# Patient Record
Sex: Female | Born: 1985 | Race: Black or African American | Hispanic: No | Marital: Single | Smoking: Never smoker
Health system: Southern US, Community
[De-identification: ages and names within clinical notes are randomized; demographics above are authoritative.]

## PROBLEM LIST (undated history)

## (undated) ENCOUNTER — Ambulatory Visit: Disposition: A | Discharge status: Home / Self Care | Payer: 59

## (undated) DIAGNOSIS — G500 Trigeminal neuralgia: Secondary | ICD-10-CM

## (undated) DIAGNOSIS — A6000 Herpesviral infection of urogenital system, unspecified: Secondary | ICD-10-CM

## (undated) DIAGNOSIS — Z9289 Personal history of other medical treatment: Secondary | ICD-10-CM

## (undated) DIAGNOSIS — G8929 Other chronic pain: Secondary | ICD-10-CM

## (undated) DIAGNOSIS — E669 Obesity, unspecified: Secondary | ICD-10-CM

## (undated) DIAGNOSIS — M25562 Pain in left knee: Secondary | ICD-10-CM

## (undated) DIAGNOSIS — M25561 Pain in right knee: Secondary | ICD-10-CM

## (undated) HISTORY — DX: Pain in right knee: M25.561

## (undated) HISTORY — DX: Obesity, unspecified: E66.9

## (undated) HISTORY — DX: Pain in left knee: M25.562

## (undated) HISTORY — DX: Trigeminal neuralgia: G50.0

## (undated) HISTORY — PX: WISDOM TOOTH EXTRACTION: SHX21

## (undated) HISTORY — DX: Personal history of other medical treatment: Z92.89

## (undated) HISTORY — DX: Herpesviral infection of urogenital system, unspecified: A60.00

## (undated) HISTORY — DX: Other chronic pain: G89.29

---
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
## 2004-01-26 ENCOUNTER — Emergency Department (HOSPITAL_COMMUNITY): Admission: EM | Admit: 2004-01-26 | Discharge: 2004-01-27 | Payer: Self-pay | Admitting: Emergency Medicine

## 2004-02-24 ENCOUNTER — Emergency Department (HOSPITAL_COMMUNITY): Admission: EM | Admit: 2004-02-24 | Discharge: 2004-02-24 | Payer: Self-pay | Admitting: Emergency Medicine

## 2005-01-27 ENCOUNTER — Observation Stay: Payer: Self-pay

## 2005-01-29 ENCOUNTER — Observation Stay: Payer: Self-pay | Admitting: Unknown Physician Specialty

## 2005-03-08 ENCOUNTER — Observation Stay: Payer: Self-pay | Admitting: Unknown Physician Specialty

## 2005-03-16 ENCOUNTER — Observation Stay: Payer: Self-pay

## 2005-04-14 ENCOUNTER — Inpatient Hospital Stay: Payer: Self-pay

## 2005-12-25 ENCOUNTER — Emergency Department: Payer: Self-pay | Admitting: Emergency Medicine

## 2007-01-24 ENCOUNTER — Emergency Department: Payer: Self-pay | Admitting: Emergency Medicine

## 2008-05-19 ENCOUNTER — Emergency Department: Payer: Self-pay | Admitting: Emergency Medicine

## 2012-11-13 ENCOUNTER — Emergency Department: Payer: Self-pay | Admitting: Emergency Medicine

## 2012-11-13 LAB — HCG, QUANTITATIVE, PREGNANCY: Beta Hcg, Quant.: <1 m[IU]/mL — ABNORMAL LOW

## 2012-11-13 LAB — CBC
HCT: 38.7 % (ref 35.0–47.0)
MCV: 83 fL (ref 80–100)
Platelet: 288 10*3/uL (ref 150–440)
RBC: 4.69 10*6/uL (ref 3.80–5.20)
RDW: 13.6 % (ref 11.5–14.5)
WBC: 9.4 10*3/uL (ref 3.6–11.0)

## 2012-11-13 LAB — COMPREHENSIVE METABOLIC PANEL
Alkaline Phosphatase: 62 U/L (ref 50–136)
Anion Gap: 6 — ABNORMAL LOW (ref 7–16)
BUN: 7 mg/dL (ref 7–18)
Bilirubin,Total: 0.4 mg/dL (ref 0.2–1.0)
Calcium, Total: 9 mg/dL (ref 8.5–10.1)
Chloride: 103 mmol/L (ref 98–107)
Co2: 29 mmol/L (ref 21–32)
Creatinine: 0.93 mg/dL (ref 0.60–1.30)
EGFR (African American): >60
EGFR (Non-African Amer.): >60
Osmolality: 274 (ref 275–301)
SGPT (ALT): 21 U/L (ref 12–78)
Sodium: 138 mmol/L (ref 136–145)
Total Protein: 8 g/dL (ref 6.4–8.2)

## 2012-11-13 LAB — WET PREP, GENITAL

## 2012-11-14 LAB — URINALYSIS, COMPLETE
Bilirubin,UR: NEGATIVE
Glucose,UR: NEGATIVE mg/dL (ref 0–75)
Nitrite: POSITIVE
RBC,UR: 2568 /HPF (ref 0–5)
Specific Gravity: 1.025 (ref 1.003–1.030)
Squamous Epithelial: 8
WBC UR: 3 /HPF (ref 0–5)

## 2014-06-10 ENCOUNTER — Ambulatory Visit: Payer: Self-pay | Admitting: Neurology

## 2014-06-10 LAB — HCG, QUANTITATIVE, PREGNANCY: Beta Hcg, Quant.: <1 m[IU]/mL — ABNORMAL LOW

## 2014-06-18 ENCOUNTER — Emergency Department: Payer: Self-pay | Admitting: Emergency Medicine

## 2014-10-30 ENCOUNTER — Ambulatory Visit: Payer: Self-pay | Admitting: Emergency Medicine

## 2014-10-30 LAB — PREGNANCY, URINE: Pregnancy Test, Urine: NEGATIVE m[IU]/mL

## 2014-11-17 ENCOUNTER — Ambulatory Visit: Payer: Self-pay | Admitting: Physician Assistant

## 2014-11-20 ENCOUNTER — Ambulatory Visit: Payer: Self-pay | Admitting: Family Medicine

## 2014-12-29 ENCOUNTER — Emergency Department: Payer: Self-pay | Admitting: Emergency Medicine

## 2015-03-10 ENCOUNTER — Ambulatory Visit: Payer: Self-pay | Admitting: Family Medicine

## 2016-01-21 ENCOUNTER — Encounter: Payer: Self-pay | Admitting: Emergency Medicine

## 2016-01-21 ENCOUNTER — Ambulatory Visit
Admission: EM | Admit: 2016-01-21 | Discharge: 2016-01-21 | Disposition: A | Discharge status: Home / Self Care | Payer: BLUE CROSS/BLUE SHIELD | Attending: Family Medicine | Admitting: Family Medicine

## 2016-01-21 DIAGNOSIS — J069 Acute upper respiratory infection, unspecified: Secondary | ICD-10-CM

## 2016-01-21 MED ORDER — AZITHROMYCIN 250 MG PO TABS: ORAL_TABLET | ORAL | Status: DC

## 2016-01-21 MED ORDER — HYDROCOD POLST-CPM POLST ER 10-8 MG/5ML PO SUER: 5.0000 mL | Freq: Every evening | ORAL | Status: DC | PRN

## 2016-01-21 NOTE — ED Provider Notes (Signed)
Patient presents initially with symptoms of nasal congestion and mild productive cough. Patient states that she's had the symptoms since last Thursday. She has coughed so much that it has caused her to vomit at times. She has been taking Delsym and an allergy medication that the pharmacist had recommended. She denies any fevers, chest pain, shortness of breath, severe headache, abdominal pain, diarrhea. She denies any history of smoking, asthma, COPD.  ROS: Negative except mentioned above.  Vitals as per Epic.  GENERAL: NAD HEENT: mild pharyngeal erythema, no exudate, no erythema of TMs, no cervical LAD RESP: CTA B CARD: RRR NEURO: CN II-XII grossly intact  A/P: URI- Z-Pak, Tussionex when necessary at bedtime, Delsym during the daytime, Claritin when necessary, Tylenol/Motrin prn, rest, hydration, seek medical attention if symptoms persist or worsen as discussed.   Jolene Provost, MD 01/21/16 [phone]

## 2016-01-21 NOTE — ED Notes (Signed)
Patient c/o cough and chest congestion since last Thursday.  Patient states that she vomited once last night.  Patient denies fevers.

## 2016-02-21 ENCOUNTER — Emergency Department
Admission: EM | Admit: 2016-02-21 | Discharge: 2016-02-21 | Disposition: A | Discharge status: Home / Self Care | Payer: 59 | Attending: Emergency Medicine | Admitting: Emergency Medicine

## 2016-02-21 ENCOUNTER — Encounter: Payer: Self-pay | Admitting: Emergency Medicine

## 2016-02-21 DIAGNOSIS — Y998 Other external cause status: Secondary | ICD-10-CM | POA: Diagnosis not present

## 2016-02-21 DIAGNOSIS — S4990XA Unspecified injury of shoulder and upper arm, unspecified arm, initial encounter: Secondary | ICD-10-CM | POA: Diagnosis not present

## 2016-02-21 DIAGNOSIS — Y92410 Unspecified street and highway as the place of occurrence of the external cause: Secondary | ICD-10-CM | POA: Diagnosis not present

## 2016-02-21 DIAGNOSIS — S199XXA Unspecified injury of neck, initial encounter: Secondary | ICD-10-CM | POA: Diagnosis present

## 2016-02-21 DIAGNOSIS — Y9389 Activity, other specified: Secondary | ICD-10-CM | POA: Diagnosis not present

## 2016-02-21 DIAGNOSIS — S161XXA Strain of muscle, fascia and tendon at neck level, initial encounter: Secondary | ICD-10-CM | POA: Insufficient documentation

## 2016-02-21 DIAGNOSIS — Z79899 Other long term (current) drug therapy: Secondary | ICD-10-CM | POA: Diagnosis not present

## 2016-02-21 MED ORDER — TRAMADOL HCL 50 MG PO TABS: 50.0000 mg | ORAL_TABLET | Freq: Four times a day (QID) | ORAL | Status: AC | PRN

## 2016-02-21 MED ORDER — IBUPROFEN 600 MG PO TABS: 600.0000 mg | ORAL_TABLET | Freq: Three times a day (TID) | ORAL | Status: DC | PRN

## 2016-02-21 MED ORDER — METHOCARBAMOL 750 MG PO TABS: 1500.0000 mg | ORAL_TABLET | Freq: Four times a day (QID) | ORAL | Status: DC
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
## 2016-02-21 NOTE — Discharge Instructions (Signed)

## 2016-11-30 ENCOUNTER — Emergency Department: Payer: 59

## 2016-11-30 ENCOUNTER — Emergency Department
Admission: EM | Admit: 2016-11-30 | Discharge: 2016-11-30 | Disposition: A | Discharge status: Home / Self Care | Payer: 59 | Attending: Emergency Medicine | Admitting: Emergency Medicine

## 2016-11-30 ENCOUNTER — Encounter: Payer: Self-pay | Admitting: Emergency Medicine

## 2016-11-30 DIAGNOSIS — Y929 Unspecified place or not applicable: Secondary | ICD-10-CM | POA: Diagnosis not present

## 2016-11-30 DIAGNOSIS — Z79899 Other long term (current) drug therapy: Secondary | ICD-10-CM | POA: Insufficient documentation

## 2016-11-30 DIAGNOSIS — Y939 Activity, unspecified: Secondary | ICD-10-CM | POA: Diagnosis not present

## 2016-11-30 DIAGNOSIS — S299XXA Unspecified injury of thorax, initial encounter: Secondary | ICD-10-CM | POA: Diagnosis present

## 2016-11-30 DIAGNOSIS — X58XXXA Exposure to other specified factors, initial encounter: Secondary | ICD-10-CM | POA: Diagnosis not present

## 2016-11-30 DIAGNOSIS — S29012A Strain of muscle and tendon of back wall of thorax, initial encounter: Secondary | ICD-10-CM | POA: Diagnosis not present

## 2016-11-30 DIAGNOSIS — Y999 Unspecified external cause status: Secondary | ICD-10-CM | POA: Insufficient documentation

## 2016-11-30 DIAGNOSIS — T148XXA Other injury of unspecified body region, initial encounter: Secondary | ICD-10-CM

## 2016-11-30 MED ORDER — KETOROLAC TROMETHAMINE 60 MG/2ML IM SOLN
60.0000 mg | Freq: Once | INTRAMUSCULAR | Status: AC
  Administered 2016-11-30: 60 mg via INTRAMUSCULAR
  Filled 2016-11-30: qty 2

## 2016-11-30 MED ORDER — MELOXICAM 15 MG PO TABS: 15.0000 mg | ORAL_TABLET | Freq: Every day | ORAL | 0 refills | Status: AC

## 2016-11-30 NOTE — ED Provider Notes (Signed)
[facility] Emergency Department Provider Note  ____________________________________________  Time seen: Approximately 6:59 PM  I have reviewed the triage vital signs and the nursing notes.   HISTORY  Chief Complaint Back Pain    HPI Sarah Tommye StandardCorinne Botts is a 30 y.o. female that presents with 2 days of mid back pain. Pain is worse with movement. Pain does not radiate. No numbness or tingling in fingers or toes. Patient denies fever, chills, trauma.   History reviewed. No pertinent past medical history.  There are no active problems to display for this patient.   Past Surgical History:  Procedure Laterality Date  . WISDOM TOOTH EXTRACTION      Prior to Admission medications   Medication Sig Start Date End Date Taking? Authorizing Provider  azithromycin (ZITHROMAX Z-PAK) 250 MG tablet Use as directed for 5 days as on box. 01/21/16   Jolene ProvostKirtida Patel, MD  chlorpheniramine-HYDROcodone [facility](TUSSIONEX PENNKINETIC ER) 10-8 MG/5ML SUER Take 5 mLs by mouth at bedtime as needed for cough. 01/21/16   Jolene ProvostKirtida Patel, MD  ibuprofen (ADVIL,MOTRIN) 600 MG tablet Take 1 tablet (600 mg total) by mouth every 8 (eight) hours as needed. 02/21/16   Joni Reiningonald K Smith, PA-C  levonorgestrel (MIRENA) 20 MCG/24HR IUD 1 each by Intrauterine route once.    Historical Provider, MD  meloxicam (MOBIC) 15 MG tablet Take 1 tablet (15 mg total) by mouth daily. 11/30/16 12/10/16  Enid DerryAshley Tanav Orsak, PA-C  methocarbamol (ROBAXIN-750) 750 MG tablet Take 2 tablets (1,500 mg total) by mouth 4 (four) times daily. 02/21/16   Joni Reiningonald K Smith, PA-C  traMADol (ULTRAM) 50 MG tablet Take 1 tablet (50 mg total) by mouth every 6 (six) hours as needed. 02/21/16 02/20/17  Joni Reiningonald K Smith, PA-C    Allergies Patient has no known allergies.  No family history on file.  Social History Social History  Substance Use Topics  . Smoking status: Never Smoker  . Smokeless tobacco: Never Used  . Alcohol use No     Review of  Systems  Constitutional: No fever/chills ENT: No upper respiratory complaints. Cardiovascular: no chest pain. Respiratory: no cough. No SOB. Gastrointestinal: No abdominal pain.  No nausea, no vomiting.   Genitourinary: Negative for incontinence. Musculoskeletal: Negative for pain in upper or lower extremities. Skin: Negative for rash, abrasions, lacerations, ecchymosis. Neurological: Negative for headaches, focal weakness, numbess.  ____________________________________________   PHYSICAL EXAM:  VITAL SIGNS: ED Triage Vitals [11/30/16 1822]  Enc Vitals Group     BP 128/69     Pulse Rate 80     Resp 18     Temp 99.1 F (37.3 C)     Temp Source Oral     SpO2 98 %     Weight 230 lb (104.3 kg)     Height 5\' 10"  (1.778 m)     Head Circumference      Peak Flow      Pain Score 10     Pain Loc      Pain Edu?      Excl. in GC?      Constitutional: Alert and oriented. Well appearing and in no acute distress. Eyes: Conjunctivae are normal. PERRL. EOMI. Head: Atraumatic. ENT:      Ears:       Nose: No congestion/rhinnorhea.      Mouth/Throat: Mucous membranes are moist.  Neck: No stridor.  Cardiovascular: Normal rate, regular rhythm. Normal S1 and S2.  Good peripheral circulation. Respiratory: Normal respiratory effort without tachypnea or retractions. Lungs  CTAB. Good air entry to the bases with no decreased or absent breath sounds. Gastrointestinal:. Soft and nontender to palpation. Musculoskeletal: Full range of motion to all extremities. No gross deformities appreciated. Pinpoint tenderness over thoracic spine. Tenderness over muscles on the left side of mid back. No tenderness over SI joints. Neurologic:  Normal speech and language. No gross focal neurologic deficits are appreciated.  Skin:  Skin is warm, dry and intact. No rash noted. Psychiatric: Mood and affect are normal. Speech and behavior are normal. Patient exhibits appropriate insight and  judgement.   ____________________________________________   LABS (all labs ordered are listed, but only abnormal results are displayed)  Labs Reviewed - No data to display ____________________________________________  EKG   ____________________________________________  RADIOLOGY Lexine BatonI, Harsha Yusko, personally viewed and evaluated these images (plain radiographs) as part of my medical decision making, as well as reviewing the written report by the radiologist.  Dg Thoracic Spine 2 View  Result Date: 11/30/2016 CLINICAL DATA:  Thoracic back pain, onset yesterday. No known injury. Pain point tenderness. EXAM: THORACIC SPINE 2 VIEWS COMPARISON:  [facility]. FINDINGS: The alignment is maintained. Vertebral body heights are maintained. No significant disc space narrowing. Posterior elements appear intact. No fracture. There is no paravertebral soft tissue abnormality. IMPRESSION: Negative radiographs of the thoracic spine. Electronically Signed   By: Rubye OaksMelanie  Ehinger M.D.   On: 11/30/2016 19:21    ____________________________________________    PROCEDURES  Procedure(s) performed:    Procedures    Medications  ketorolac (TORADOL) injection 60 mg (60 mg Intramuscular Given 11/30/16 1952)     ____________________________________________   INITIAL IMPRESSION / ASSESSMENT AND PLAN / ED COURSE  Pertinent labs & imaging results that were available during my care of the patient were reviewed by me and considered in my medical decision making (see chart for details).  Review of the [place] CSRS was performed in accordance of the [facility] prior to dispensing any controlled drugs.  Clinical Course as of Dec 01 2015  Tue Nov 30, 2016  1937 DG Thoracic Spine 2 View [AW]  [phone] DG Thoracic Spine 2 View [AW]    Clinical Course User Index [AW] Enid DerryAshley Mardy Hoppe, PA-C   Patient presented to the emergency department with mid back pain for 2 days. X-ray was ordered because patient had pinpoint tenderness  over thoracic spine. No gross neurological defects are appreciated.Patient's diagnosis is consistent with muscle strain. Patient will be discharged home with prescriptions for meloxicam. Patient is to follow up with PCP as needed or otherwise directed. Patient is given ED precautions to return to the ED for any worsening or new symptoms.     ____________________________________________  FINAL CLINICAL IMPRESSION(S) / ED DIAGNOSES  Final diagnoses:  Muscle strain      NEW MEDICATIONS STARTED DURING THIS VISIT:  Discharge Medication List as of 11/30/2016  8:00 PM    START taking these medications   Details  meloxicam (MOBIC) 15 MG tablet Take 1 tablet (15 mg total) by mouth daily., Starting Tue 11/30/2016, Until Fri 12/10/2016, Print            This chart was dictated using voice recognition software/Dragon. Despite best efforts to proofread, errors can occur which can change the meaning. Any change was purely unintentional.   Enid DerryAshley Marcene Laskowski, PA-C 11/30/16 2018    Rockne MenghiniAnne-Caroline Norman, MD 11/30/16 [phone]

## 2016-11-30 NOTE — ED Triage Notes (Signed)
Patient presents to the ED with mid-back pain since yesterday.  Patient denies history of back pain and denies known injury.  Patient reports pain is worse with movement.  Patient ambulatory to triage with no obvious distress.

## 2016-11-30 NOTE — ED Notes (Signed)

## 2017-02-06 DIAGNOSIS — Z79899 Other long term (current) drug therapy: Secondary | ICD-10-CM | POA: Diagnosis not present

## 2017-02-06 DIAGNOSIS — R51 Headache: Secondary | ICD-10-CM | POA: Diagnosis not present

## 2017-02-06 DIAGNOSIS — R6884 Jaw pain: Secondary | ICD-10-CM | POA: Diagnosis not present

## 2017-02-06 DIAGNOSIS — G500 Trigeminal neuralgia: Secondary | ICD-10-CM | POA: Diagnosis not present

## 2017-02-08 DIAGNOSIS — R51 Headache: Secondary | ICD-10-CM | POA: Diagnosis not present

## 2017-03-08 DIAGNOSIS — R51 Headache: Secondary | ICD-10-CM | POA: Diagnosis not present

## 2017-06-07 DIAGNOSIS — M25561 Pain in right knee: Secondary | ICD-10-CM | POA: Diagnosis not present

## 2017-06-07 DIAGNOSIS — G500 Trigeminal neuralgia: Secondary | ICD-10-CM | POA: Diagnosis not present

## 2017-07-21 ENCOUNTER — Ambulatory Visit (INDEPENDENT_AMBULATORY_CARE_PROVIDER_SITE_OTHER): Payer: 59 | Admitting: Certified Nurse Midwife

## 2017-07-21 ENCOUNTER — Encounter: Payer: Self-pay | Admitting: Certified Nurse Midwife

## 2017-07-21 VITALS — BP 112/72 | HR 77 | Ht 70.0 in | Wt 250.0 lb

## 2017-07-21 DIAGNOSIS — M25562 Pain in left knee: Secondary | ICD-10-CM

## 2017-07-21 DIAGNOSIS — E669 Obesity, unspecified: Secondary | ICD-10-CM | POA: Insufficient documentation

## 2017-07-21 DIAGNOSIS — E66812 Obesity, class 2: Secondary | ICD-10-CM

## 2017-07-21 DIAGNOSIS — Z124 Encounter for screening for malignant neoplasm of cervix: Secondary | ICD-10-CM | POA: Diagnosis not present

## 2017-07-21 DIAGNOSIS — Z13220 Encounter for screening for lipoid disorders: Secondary | ICD-10-CM | POA: Diagnosis not present

## 2017-07-21 DIAGNOSIS — A6000 Herpesviral infection of urogenital system, unspecified: Secondary | ICD-10-CM | POA: Diagnosis not present

## 2017-07-21 DIAGNOSIS — M25561 Pain in right knee: Secondary | ICD-10-CM

## 2017-07-21 DIAGNOSIS — Z6835 Body mass index (BMI) 35.0-35.9, adult: Secondary | ICD-10-CM

## 2017-07-21 DIAGNOSIS — R8290 Unspecified abnormal findings in urine: Secondary | ICD-10-CM

## 2017-07-21 DIAGNOSIS — G8929 Other chronic pain: Secondary | ICD-10-CM | POA: Insufficient documentation

## 2017-07-21 DIAGNOSIS — E6609 Other obesity due to excess calories: Secondary | ICD-10-CM

## 2017-07-21 DIAGNOSIS — Z1329 Encounter for screening for other suspected endocrine disorder: Secondary | ICD-10-CM | POA: Diagnosis not present

## 2017-07-21 DIAGNOSIS — R7309 Other abnormal glucose: Secondary | ICD-10-CM | POA: Diagnosis not present

## 2017-07-21 DIAGNOSIS — R3915 Urgency of urination: Secondary | ICD-10-CM | POA: Diagnosis not present

## 2017-07-21 DIAGNOSIS — Z01419 Encounter for gynecological examination (general) (routine) without abnormal findings: Secondary | ICD-10-CM

## 2017-07-21 DIAGNOSIS — G500 Trigeminal neuralgia: Secondary | ICD-10-CM | POA: Insufficient documentation

## 2017-07-21 DIAGNOSIS — Z113 Encounter for screening for infections with a predominantly sexual mode of transmission: Secondary | ICD-10-CM

## 2017-07-21 LAB — POCT WET PREP (WET MOUNT): TRICHOMONAS WET PREP HPF POC: ABSENT

## 2017-07-21 LAB — POCT URINALYSIS DIPSTICK
Blood, UA: NEGATIVE
GLUCOSE UA: NEGATIVE
LEUKOCYTES UA: NEGATIVE
Nitrite, UA: NEGATIVE
Spec Grav, UA: 1.015 (ref 1.010–1.025)
Urobilinogen, UA: 0.2 E.U./dL
pH, UA: 6.5 (ref 5.0–8.0)

## 2017-07-21 MED ORDER — TINIDAZOLE 500 MG PO TABS: 1.0000 g | ORAL_TABLET | Freq: Every day | ORAL | 0 refills | Status: AC

## 2017-07-21 MED ORDER — VALACYCLOVIR HCL 500 MG PO TABS: ORAL_TABLET | ORAL | 1 refills | Status: DC

## 2017-07-21 MED ORDER — SECNIDAZOLE 2 G PO PACK: 2.0000 g | PACK | Freq: Once | ORAL | 0 refills | Status: DC

## 2017-07-21 NOTE — Progress Notes (Signed)
Gynecology Annual Exam  PCP: [facility] in [place] Chief Complaint:  Chief Complaint  Patient presents with  . Gynecologic Exam    History of Present Illness: Sarah Bryant StandardCorinne Bryant is a 31 y.o. G2P1011 presents for annual exam. The patient complains of a "strong odor to her urine, urgency and urinating small amounts intermittently. No dysuria.  Her menses are absent due to her Mirena IUD Last pap smear: 05/26/2016, results were NIL/neg   The patient is sexually active. She has had a new sexual partner in the last year and desires STD testing. She currently uses the Mirena IUD for contraception. Her Mirena was inserted 11/14/2012  Since her last visit, she has had no significant changes in her health.  Her past medical history is remarkable for HSVII (uses Valtrex episodically), trigeminal neuralgia, obesity.  The patient does not perform self breast exams. Her last mammogram was NA  There is no family history of breast cancer.    There is no family history of ovarian cancer.   The patient denies smoking.  She denies drinking. alcohol  She denies illegal drug use.  The patient reports exercising occasionally. Has gained 4# in the last year. Current BMI=35.87 kg/m2  The patient gets adequate calcium in her diet.  Her last cholesterol screen was in 2016 and was  Borderline (T chol 201) and her last diabetes screen was 2017 and her hemoglobin A1C was 6.1%.  The patient denies current symptoms of depression.    Review of Systems: Review of Systems  Constitutional: Negative for chills, fever and weight loss.  HENT: Negative for congestion, sinus pain and sore throat.   Eyes: Negative for blurred vision and pain.  Respiratory: Negative for hemoptysis, shortness of breath and wheezing.   Cardiovascular: Negative for chest pain, palpitations and leg swelling.  Gastrointestinal: Negative for abdominal pain, blood in stool, diarrhea, heartburn, nausea and vomiting.    Genitourinary: Positive for urgency (intermittently). Negative for dysuria, frequency and hematuria.       Positive for a strong odor to urine, urgency and voiding small amounts intermittently. Positive for amenorrhea  Musculoskeletal: Positive for joint pain (bilateral knees). Negative for back pain and myalgias.  Skin: Negative for itching and rash.  Neurological: Negative for dizziness, tingling and headaches.  Endo/Heme/Allergies: Negative for environmental allergies and polydipsia. Does not bruise/bleed easily.       Negative for hirsutism   Psychiatric/Behavioral: Negative for depression. The patient is not nervous/anxious and does not have insomnia.     Past Medical History:  Past Medical History:  Diagnosis Date  . Bilateral chronic knee pain   . Genital herpes   . Obesity (BMI 35.0-39.9 without comorbidity)   . Trigeminal neuralgia pain     Past Surgical History:  Past Surgical History:  Procedure Laterality Date  . WISDOM TOOTH EXTRACTION      Family History:  Family History  Problem Relation Age of Onset  . Thyroid disease Father   . Lung cancer Maternal Grandmother [id number]  . Breast cancer Neg Hx   . Ovarian cancer Neg Hx   . Diabetes Neg Hx     Social History:  Social History   Social History  . Marital status: Single    Spouse name: N/A  . Number of children: 1  . Years of education: N/A   Occupational History  . Store Production designer, theatre/television/filmManager    Social History Main Topics  . Smoking status: Never Smoker  . Smokeless tobacco: Never Used  .  Alcohol use No  . Drug use: No  . Sexual activity: Yes    Partners: Male    Birth control/ protection: IUD   Other Topics Concern  . Not on file   Social History Narrative  . No narrative on file    Allergies:  No Known Allergies  Medications: Prior to Admission medications   Medication Sig Start Date End Date Taking? Authorizing Provider  levonorgestrel (MIRENA) 20 MCG/24HR IUD 1 each by Intrauterine route once.    Yes [provider]  Gabapentin 100 mgm prn pain from trigeminal neuralgia Valtrex 500 mgm BID x 3-5 days prn outbreak  Physical Exam Vitals:BP 112/72   Pulse 77   Ht 5\' 10"  (1.778 m)   Wt 250 lb (113.4 kg)   BMI 35.87 kg/m   General: BF in NAD HEENT: normocephalic, anicteric Neck: no thyroid enlargement, no palpable nodules, no cervical lymphadenopathy  Pulmonary: No increased work of breathing, CTAB Cardiovascular: RRR, without murmur  Breast: Breast symmetrical, no tenderness, no palpable nodules or masses, no skin or nipple retraction present, no nipple discharge.  No axillary, infraclavicular or supraclavicular lymphadenopathy. Abdomen: Soft, obese, non-tender, non-distended.  Umbilicus without lesions. Tatoos on lower abdomen. No hepatomegaly or masses palpable. No evidence of hernia. Genitourinary:  External: Normal external female genitalia.  Normal urethral meatus, normal Bartholin's and Skene's glands.    Vagina: Normal vaginal mucosa, no evidence of prolapse, small amount clear discharge  Cervix: Grossly normal in appearance, no bleeding, non-tender, IUD strings present  Uterus: Retroverted, normal size, shape, and consistency, decreased mobility, and non-tender  Adnexa: No adnexal masses, non-tender  Rectal: deferred  Lymphatic: no evidence of inguinal lymphadenopathy Extremities: no edema, erythema, or tenderness Neurologic: Grossly intact Psychiatric: mood appropriate, affect full  Results for orders placed or performed in visit on 07/21/17 (from the past 24 hour(s))  POCT Wet Prep Mellody [place])     Status: Abnormal   Collection Time: 07/21/17  4:39 PM  Result Value Ref Range   Source Wet Prep POC vaginal    WBC, Wet Prep HPF POC     Bacteria Wet Prep HPF POC  Few   BACTERIA WET PREP MORPHOLOGY POC     Clue Cells Wet Prep HPF POC Moderate (A) None   Clue Cells Wet Prep Whiff POC     Yeast Wet Prep HPF POC None    KOH Wet Prep POC     Trichomonas Wet  Prep HPF POC Absent Absent  POCT Urinalysis Dipstick     Status: Normal   Collection Time: 07/21/17  4:40 PM  Result Value Ref Range   Color, UA dark yellow    Clarity, UA clear    Glucose, UA neg    Bilirubin, UA     Ketones, UA trace    Spec Grav, UA 1.015 1.010 - 1.025   Blood, UA neg    pH, UA 6.5 5.0 - 8.0   Protein, UA trace    Urobilinogen, UA 0.2 0.2 or 1.0 E.U./dL   Nitrite, UA neg    Leukocytes, UA Negative Negative     Assessment: 31 y.o. [id number] annual gyn exam Urinalysis was negative. Wet prep positive for bacterial vaginosis HSV II  Plan:   1) Breast cancer screening - recommend monthly self breast exam.   2) STI screening was done including RPR, HIV, GC and Chlamydia testing  3) Cervical cancer screening - Pap was done. ASCCP guidelines and rational discussed.  Patient opts for yearly screening interval  4) Contraception-Mirena expires this year. Patient desires replacement of Mirena-to schedule later this year,  5) Routine healthcare maintenance labs done including TSH, hemoglobin A1C, lipid panel.  6) Will call with results  Farrel Connersolleen Ciel Chervenak, CNM

## 2017-07-22 LAB — HGB A1C W/O EAG: Hgb A1c MFr Bld: 5.5 % (ref 4.8–5.6)

## 2017-07-22 LAB — HIV ANTIBODY (ROUTINE TESTING W REFLEX): HIV SCREEN 4TH GENERATION: NONREACTIVE

## 2017-07-22 LAB — RPR QUALITATIVE: RPR: NONREACTIVE

## 2017-07-22 LAB — TSH: TSH: 1.66 u[IU]/mL (ref 0.450–4.500)

## 2017-07-25 LAB — PAP IG, CT-NG, RFX HPV ALL
CHLAMYDIA, NUC. ACID AMP: NEGATIVE
Gonococcus by Nucleic Acid Amp: NEGATIVE
PAP Smear Comment: 0

## 2018-02-06 ENCOUNTER — Ambulatory Visit
Admission: EM | Admit: 2018-02-06 | Discharge: 2018-02-06 | Disposition: A | Discharge status: Home / Self Care | Payer: 59 | Attending: Family Medicine | Admitting: Family Medicine

## 2018-02-06 ENCOUNTER — Encounter: Payer: Self-pay | Admitting: *Deleted

## 2018-02-06 DIAGNOSIS — J029 Acute pharyngitis, unspecified: Secondary | ICD-10-CM | POA: Diagnosis not present

## 2018-02-06 DIAGNOSIS — B349 Viral infection, unspecified: Secondary | ICD-10-CM

## 2018-02-06 LAB — RAPID STREP SCREEN (MED CTR MEBANE ONLY): Streptococcus, Group A Screen (Direct): NEGATIVE

## 2018-02-06 MED ORDER — SUMATRIPTAN SUCCINATE 50 MG PO TABS: 50.0000 mg | ORAL_TABLET | Freq: Once | ORAL | 0 refills | Status: DC

## 2018-02-06 MED ORDER — FLUTICASONE PROPIONATE 50 MCG/ACT NA SUSP: 2.0000 | Freq: Every day | NASAL | 0 refills | Status: DC

## 2018-02-06 NOTE — Discharge Instructions (Signed)
Flonase will help the post nasal drip and sore throat.  Use the Imitrex for migraine.  Take care  Dr. Adriana Simasook

## 2018-02-06 NOTE — ED Provider Notes (Signed)
[facility]    CSN: [id number] Arrival date & time: 02/06/18  1942  History   Chief Complaint Chief Complaint  Patient presents with  . Sore Throat   HPI  32 year old female presents with sore throat and ongoing migraines.  Patient reports a 3-day history of sore throat.  No other upper respiratory symptoms.  No fevers or chills.  No known exacerbating or relieving factors.  Additionally, patient states that she has a long history of migraine headache.  She is currently battling a migraine.  Located on the right side around the eye associated photophobia and phonophobia.  Currently 7/10 in severity.  He has had some improvement with Excedrin Migraine.  No other medications tried.  She is not currently seeing a provider regarding her migraines.  No other associated symptoms.  No other complaints.  Past Medical History:  Diagnosis Date  . Bilateral chronic knee pain   . Genital herpes   . Obesity (BMI 35.0-39.9 without comorbidity)   . Trigeminal neuralgia pain     Patient Active Problem List   Diagnosis Date Noted  . Obesity (BMI 35.0-39.9 without comorbidity)   . Genital herpes   . Bilateral chronic knee pain   . Trigeminal neuralgia pain     Past Surgical History:  Procedure Laterality Date  . WISDOM TOOTH EXTRACTION      OB History    Gravida Para Term Preterm AB Living   2 1 1   1 1    SAB TAB Ectopic Multiple Live Births     1     1       Home Medications    Prior to Admission medications   Medication Sig Start Date End Date Taking? Authorizing Provider  fluticasone (FLONASE) 50 MCG/ACT nasal spray Place 2 sprays into both nostrils daily. 02/06/18   Tommie Sams, DO  levonorgestrel (MIRENA) 20 MCG/24HR IUD 1 each by Intrauterine route once.    [provider]  SUMAtriptan (IMITREX) 50 MG tablet Take 1 tablet (50 mg total) by mouth once for 1 dose. May repeat in 2 hours if headache persists or recurs. 02/06/18 02/06/18  Tommie Sams, DO     Family History Family History  Problem Relation Age of Onset  . Thyroid disease Father   . Lung cancer Maternal Grandmother 6  . Breast cancer Neg Hx   . Ovarian cancer Neg Hx   . Diabetes Neg Hx     Social History Social History   Tobacco Use  . Smoking status: Never Smoker  . Smokeless tobacco: Never Used  Substance Use Topics  . Alcohol use: No  . Drug use: No     Allergies   Patient has no known allergies.   Review of Systems Review of Systems  HENT: Positive for sore throat.   Neurological: Positive for headaches.   Physical Exam Triage Vital Signs ED Triage Vitals  Enc Vitals Group     BP 02/06/18 1957 131/72     Pulse Rate 02/06/18 1957 66     Resp 02/06/18 1957 16     Temp 02/06/18 1957 99.2 F (37.3 C)     Temp src --      SpO2 02/06/18 1957 98 %     Weight 02/06/18 1955 235 lb (106.6 kg)     Height 02/06/18 1955 5\' 10"  (1.778 m)     Head Circumference --      Peak Flow --      Pain Score 02/06/18  1954 7     Pain Loc --      Pain Edu? --      Excl. in GC? --    Updated Vital Signs BP 131/72 (BP Location: Left Arm)   Pulse 66   Temp 99.2 F (37.3 C)   Resp 16   Ht 5\' 10"  (1.778 m)   Wt 235 lb (106.6 kg)   SpO2 98%   BMI 33.72 kg/m   Physical Exam  Constitutional: She is oriented to person, place, and time. She appears well-developed and well-nourished. No distress.  HENT:  Head: Normocephalic and atraumatic.  Oropharynx with mild erythema.  Cobblestoning noted.  Cardiovascular: Normal rate and regular rhythm.  No murmur heard. Pulmonary/Chest: Effort normal and breath sounds normal. She has no wheezes. She has no rales.  Neurological: She is alert and oriented to person, place, and time.  No apparent focal deficits.  Psychiatric: She has a normal mood and affect. Her behavior is normal.  Nursing note and vitals reviewed.  UC Treatments / Results  Labs (all labs ordered are listed, but only abnormal results are  displayed) Labs Reviewed  RAPID STREP SCREEN (NOT AT [facility])  CULTURE, GROUP A STREP [facility])    EKG  EKG Interpretation None       Radiology No results found.  Procedures Procedures (including critical care time)  Medications Ordered in UC Medications - No data to display   Initial Impression / Assessment and Plan / UC Course  I have reviewed the triage vital signs and the nursing notes.  Pertinent labs & imaging results that were available during my care of the patient were reviewed by me and considered in my medical decision making (see chart for details).    32 year old female presents with viral pharyngitis.  Advised Flonase.  Supportive care.  Regarding her frequent migraines, I have prescribed Imitrex that she should use for abortive therapy.  She is currently having them 3 times a month.  If continues, she needs to follow-up with primary regarding prophylaxis.  Final Clinical Impressions(s) / UC Diagnoses   Final diagnoses:  Viral pharyngitis    ED Discharge Orders        Ordered    fluticasone (FLONASE) 50 MCG/ACT nasal spray  Daily     02/06/18 2057    SUMAtriptan (IMITREX) 50 MG tablet   Once     02/06/18 2057     Controlled Substance Prescriptions [place] Controlled Substance Registry consulted? Not Applicable   Tommie SamsCook, Sky Borboa G, DO 02/06/18 2148

## 2018-02-06 NOTE — ED Triage Notes (Signed)
C/O sore throat since three days ago. No fever reported.

## 2018-02-09 LAB — CULTURE, GROUP A STREP (THRC)

## 2018-03-07 ENCOUNTER — Other Ambulatory Visit: Payer: Self-pay | Admitting: Family Medicine

## 2018-03-11 ENCOUNTER — Other Ambulatory Visit: Payer: Self-pay | Admitting: Family Medicine

## 2018-03-22 ENCOUNTER — Telehealth: Payer: Self-pay | Admitting: Certified Nurse Midwife

## 2018-03-22 NOTE — Telephone Encounter (Signed)
Patient is schedule 03/30/18 with [facility] for Mirena removal and reinsertion.

## 2018-03-23 NOTE — Telephone Encounter (Signed)
Noted. Mirena reserved 

## 2018-03-30 ENCOUNTER — Ambulatory Visit (INDEPENDENT_AMBULATORY_CARE_PROVIDER_SITE_OTHER): Payer: 59 | Admitting: Certified Nurse Midwife

## 2018-03-30 ENCOUNTER — Encounter: Payer: Self-pay | Admitting: Certified Nurse Midwife

## 2018-03-30 VITALS — BP 108/70 | HR 70 | Ht 70.0 in | Wt 249.0 lb

## 2018-03-30 DIAGNOSIS — Z30433 Encounter for removal and reinsertion of intrauterine contraceptive device: Secondary | ICD-10-CM

## 2018-03-30 MED ORDER — VALACYCLOVIR HCL 500 MG PO TABS: ORAL_TABLET | ORAL | 2 refills | Status: DC

## 2018-03-30 NOTE — Progress Notes (Signed)
    GYNECOLOGY OFFICE PROCEDURE NOTE  Sarah Tommye StandardCorinne Saulsbury is a 32 y.o. G2P1011 here for Mirena IUD replacement. Her current IUD was inserted 11/14/2012. No GYN concerns.  Last pap smear was on 07/21/2017 and was normal.  IUD Insertion Procedure Note Patient identified, informed consent performed, consent signed.   Discussed risks of irregular bleeding, cramping, infection, expulsion,malpositioning or misplacement of the IUD outside the uterus which may require further procedure such as laparoscopy. Time out was performed.    On bimanual exam, uterus was retroflexed Speculum placed in the vagina. IUD strings of expiring IUD were visualized. The strings were grasped with a packing forceps and the IUD was removed easily and intact.  The cervix was then cleaned with Betadine x 3. Cervix was sprayed with Hurricaine anesthetic and  grasped posteriorly with a single tooth tenaculum.  Uterus easily sounded to 8.5 cm.with a Pipelle catheter, but was initially unable to insert the IUD due to some stenosis in the internal os. The uterus was then sounded with a larger diameter sound and the   Mirena  IUD was then able to be placed per manufacturer's recommendations.  Strings trimmed to 3 cm. Tenaculum was removed, and silver nitrate was applied to tenaculum sites for hemostasis.  Patient tolerated procedure well.   Patient was given post-procedure instructions.  Patient was also asked to check IUD strings periodically and follow up in 4 weeks for IUD check.  Farrel ConnersColleen Lilygrace Rodick, CNM 03/30/18

## 2018-04-27 ENCOUNTER — Ambulatory Visit (INDEPENDENT_AMBULATORY_CARE_PROVIDER_SITE_OTHER): Payer: 59 | Admitting: Certified Nurse Midwife

## 2018-04-27 VITALS — BP 100/70 | HR 66 | Ht 70.0 in | Wt 252.0 lb

## 2018-04-27 DIAGNOSIS — Z30431 Encounter for routine checking of intrauterine contraceptive device: Secondary | ICD-10-CM | POA: Diagnosis not present

## 2018-05-18 ENCOUNTER — Encounter: Payer: Self-pay | Admitting: Certified Nurse Midwife

## 2018-05-18 NOTE — Progress Notes (Signed)
  History of Present Illness:  Sarah Bryant is a 32 y.o. that had a Mirena IUD placed approximately 1 month ago. Since that time, she states that she has had no problems since then. Had a small amount of spotting initially, but no pain or bleeding since  PMHx: She  has a past medical history of Bilateral chronic knee pain, Genital herpes, Obesity (BMI 35.0-39.9 without comorbidity), and Trigeminal neuralgia pain. Also,  has a past surgical history that includes Wisdom tooth extraction., family history includes Lung cancer (age of onset: 21) in her maternal grandmother; Thyroid disease in her father.,  reports that she has never smoked. She has never used smokeless tobacco. She reports that she does not drink alcohol or use drugs.  She has a current medication list which includes the following prescription(s): levonorgestrel, sumatriptan, and valacyclovir. Also, has No Known Allergies.  ROS  Physical Exam:  BP 100/70   Pulse 66   Ht  (1.778 m)   Wt 252 lb (114.3 kg)   LMP  (LMP Unknown)   BMI 36.16 kg/m  Body mass index is 36.16 kg/m. Constitutional: Well nourished, well developed female in no acute distress Neuro: Grossly intact Psych:  Normal mood and affect.    Pelvic exam: External/BUS: no lesion, no discharge Vagina: no lesions, no bleeding Cervix: Two IUD strings were palpated at  the cervical os.   Assessment: IUD strings present in proper location; pt doing well  Plan: Patient is aware that the Mirena expires in 5 years RTO for annual exam and prn  Farrel Conners, CNM

## 2018-07-05 ENCOUNTER — Ambulatory Visit
Admission: EM | Admit: 2018-07-05 | Discharge: 2018-07-05 | Disposition: A | Discharge status: Home / Self Care | Payer: 59 | Attending: Emergency Medicine | Admitting: Emergency Medicine

## 2018-07-05 ENCOUNTER — Other Ambulatory Visit: Payer: Self-pay

## 2018-07-05 DIAGNOSIS — R51 Headache: Secondary | ICD-10-CM | POA: Diagnosis not present

## 2018-07-05 DIAGNOSIS — S161XXA Strain of muscle, fascia and tendon at neck level, initial encounter: Secondary | ICD-10-CM | POA: Diagnosis not present

## 2018-07-05 MED ORDER — METHOCARBAMOL 750 MG PO TABS: 750.0000 mg | ORAL_TABLET | ORAL | 0 refills | Status: DC

## 2018-07-05 MED ORDER — IBUPROFEN 600 MG PO TABS: 600.0000 mg | ORAL_TABLET | Freq: Four times a day (QID) | ORAL | 0 refills | Status: DC | PRN

## 2018-07-05 NOTE — Discharge Instructions (Addendum)
X 100 mg of ibuprofen with 1 g of Tylenol together 3 or 4 times a day as needed for pain.  The Robaxin will help with muscle spasms.  People tend to feel worse over the next several days, but most people are back to normal in 1 week.   Go to www.goodrx.com to look up your medications. This will give you a list of where you can find your prescriptions at the most affordable prices. Or ask the pharmacist what the cash price is, or if they have any other discount programs available to help make your medication more affordable. This can be less expensive than what you would pay with insurance.
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
## 2018-07-25 ENCOUNTER — Ambulatory Visit (INDEPENDENT_AMBULATORY_CARE_PROVIDER_SITE_OTHER): Payer: 59 | Admitting: Certified Nurse Midwife

## 2018-07-25 ENCOUNTER — Other Ambulatory Visit (HOSPITAL_COMMUNITY)
Admission: RE | Admit: 2018-07-25 | Discharge: 2018-07-25 | Disposition: A | Discharge status: Home / Self Care | Payer: 59 | Source: Ambulatory Visit | Attending: Certified Nurse Midwife | Admitting: Certified Nurse Midwife

## 2018-07-25 ENCOUNTER — Encounter: Payer: Self-pay | Admitting: Certified Nurse Midwife

## 2018-07-25 VITALS — BP 110/71 | HR 66 | Ht 70.0 in | Wt 250.2 lb

## 2018-07-25 DIAGNOSIS — Z113 Encounter for screening for infections with a predominantly sexual mode of transmission: Secondary | ICD-10-CM | POA: Diagnosis not present

## 2018-07-25 DIAGNOSIS — Z01419 Encounter for gynecological examination (general) (routine) without abnormal findings: Secondary | ICD-10-CM | POA: Insufficient documentation

## 2018-07-25 DIAGNOSIS — Z124 Encounter for screening for malignant neoplasm of cervix: Secondary | ICD-10-CM

## 2018-07-25 NOTE — Progress Notes (Signed)
Gynecology Annual Exam  PCP: [facility] in [place] Chief Complaint:  Chief Complaint  Patient presents with  . Gynecologic Exam    History of Present Illness: Sarah Bryant is a 32 y.o. G2P1011 presents for annual exam. The patient has no significant gyn complaints Her menses are absent due to her Mirena IUD. Her Mirena was replaced 03/30/2018 and she has had one day of spotting last week.  Last pap smear: 07/21/2017, results were NIL.   The patient is sexually active. She desires STD testing.  Since her last visit, she has had no significant changes in her health.  Her past medical history is remarkable for HSVII (uses Valtrex episodically), trigeminal neuralgia, obesity.  The patient does not perform self breast exams. Her last mammogram was NA  There is no family history of breast cancer.    There is no family history of ovarian cancer.   The patient denies smoking.  She denies drinking. alcohol  She denies illegal drug use.  The patient has not been exercising recently. Current BMI=35.91 kg/m2  The patient gets adequate calcium in her diet.  Her last cholesterol screen was in 2016 and was  Borderline (T chol 201) and her last diabetes screen was 2018 and her hemoglobin A1C was 5.5%  The patient denies current symptoms of depression.    Review of Systems: Review of Systems  Constitutional: Negative for chills, fever and weight loss.  HENT: Negative for congestion, sinus pain and sore throat.   Eyes: Negative for blurred vision and pain.  Respiratory: Negative for hemoptysis, shortness of breath and wheezing.   Cardiovascular: Negative for chest pain, palpitations and leg swelling.  Gastrointestinal: Negative for abdominal pain, blood in stool, diarrhea, heartburn, nausea and vomiting.  Genitourinary: Negative for dysuria, frequency, hematuria and urgency.       Positive for amenorrhea  Musculoskeletal: Positive for joint pain (bilateral knee pain).  Negative for back pain and myalgias.  Skin: Negative for itching and rash.  Neurological: Negative for dizziness, tingling and headaches.  Endo/Heme/Allergies: Negative for environmental allergies and polydipsia. Does not bruise/bleed easily.       Negative for hirsutism   Psychiatric/Behavioral: Negative for depression. The patient is not nervous/anxious and does not have insomnia.     Past Medical History:  Past Medical History:  Diagnosis Date  . Bilateral chronic knee pain   . Genital herpes   . History of Papanicolaou smear of cervix 01/18/12; 05/26/16   -/-; -/- ct/gc neg;  . Obesity (BMI 35.0-39.9 without comorbidity)   . Trigeminal neuralgia pain     Past Surgical History:  Past Surgical History:  Procedure Laterality Date  . WISDOM TOOTH EXTRACTION      Family History:  Family History  Problem Relation Age of Onset  . Thyroid disease Father   . Hyperlipidemia Father   . Hypertension Father   . Lung cancer Maternal Grandmother 78  . Breast cancer Neg Hx   . Ovarian cancer Neg Hx   . Diabetes Neg Hx     Social History:  Social History   Socioeconomic History  . Marital status: Single    Spouse name: Not on file  . Number of children: 1  . Years of education: 42  . Highest education level: Not on file  Occupational History  . Occupation: Social research officer, government    Comment: Consulting civil engineer  Social Needs  . Financial resource strain: Not on file  . Food insecurity:  Worry: Not on file    Inability: Not on file  . Transportation needs:    Medical: Not on file    Non-medical: Not on file  Tobacco Use  . Smoking status: Never Smoker  . Smokeless tobacco: Never Used  Substance and Sexual Activity  . Alcohol use: No  . Drug use: No  . Sexual activity: Yes    Partners: Male    Birth control/protection: IUD  Lifestyle  . Physical activity:    Days per week: 0 days    Minutes per session: 0 min  . Stress: Not at all  Relationships  . Social  connections:    Talks on phone: Not on file    Gets together: Not on file    Attends religious service: Not on file    Active member of club or organization: Not on file    Attends meetings of clubs or organizations: Not on file    Relationship status: Not on file  . Intimate partner violence:    Fear of current or ex partner: Not on file    Emotionally abused: Not on file    Physically abused: Not on file    Forced sexual activity: Not on file  Other Topics Concern  . Not on file  Social History Narrative  . Not on file    Allergies:  No Known Allergies  Medications: Prior to Admission medications   Medication Sig Start Date End Date Taking? Authorizing Provider  levonorgestrel (MIRENA) 20 MCG/24HR IUD 1 each by Intrauterine route once.   Yes [provider]   Valtrex 500 mgm BID x 3-5 days prn outbreak  Physical Exam Vitals:BP 110/71   Pulse 66   Ht 5\' 10"  (1.778 m)   Wt 250 lb 4 oz (113.5 kg)   LMP  (LMP Unknown)   BMI 35.91 kg/m   General: BF in NAD HEENT: normocephalic, anicteric Neck: no thyroid enlargement, no palpable nodules, no cervical lymphadenopathy  Pulmonary: No increased work of breathing, CTAB Cardiovascular: RRR, without murmur  Breast: Breast symmetrical, no tenderness, no palpable nodules or masses, no skin or nipple retraction present, no nipple discharge.  No axillary, infraclavicular or supraclavicular lymphadenopathy. Abdomen: Soft, obese, non-tender, non-distended.  Umbilicus without lesions. Tatoos on lower abdomen. No hepatomegaly or masses palpable. No evidence of hernia. Genitourinary:  External: Normal external female genitalia.  Normal urethral meatus, normal Bartholin's and Skene's glands.    Vagina: Normal vaginal mucosa, no evidence of prolapse, small amount clear discharge  Cervix: Grossly normal in appearance, anterior, no bleeding, non-tender, IUD strings present  Uterus: Retroverted, normal size, shape, and consistency,  decreased mobility, and non-tender  Adnexa: No adnexal masses, non-tender  Rectal: deferred  Lymphatic: no evidence of inguinal lymphadenopathy Extremities: no edema, erythema, or tenderness Neurologic: Grossly intact Psychiatric: mood appropriate, affect full    Assessment: 32 y.o. G2P1011 annual gyn exam History of HSV II  Plan:   1) Breast cancer screening - recommend monthly self breast exam.   2) STI screening was done including RPR, HIV, GC and Chlamydia testing  3) Cervical cancer screening - Pap was done. ASCCP guidelines and rational discussed.  Patient opts for yearly screening interval  4) Contraception- Mirena just replaced this year  5) Routine healthcare maintenance labs UTD.   6) RTO in 1 year.   Farrel Connersolleen Kailer Heindel, CNM

## 2018-07-26 LAB — HIV ANTIBODY (ROUTINE TESTING W REFLEX): HIV SCREEN 4TH GENERATION: NONREACTIVE

## 2018-07-26 LAB — RPR QUALITATIVE: RPR Ser Ql: NONREACTIVE

## 2018-07-28 ENCOUNTER — Telehealth: Payer: Self-pay | Admitting: Certified Nurse Midwife

## 2018-07-28 ENCOUNTER — Encounter (INDEPENDENT_AMBULATORY_CARE_PROVIDER_SITE_OTHER): Payer: Self-pay

## 2018-07-28 LAB — CYTOLOGY - PAP
CHLAMYDIA, DNA PROBE: POSITIVE — AB
DIAGNOSIS: NEGATIVE
Neisseria Gonorrhea: NEGATIVE

## 2018-07-28 MED ORDER — AZITHROMYCIN 500 MG PO TABS
ORAL_TABLET | ORAL | 0 refills | Status: DC
Start: 2018-07-28 — End: 2018-08-31

## 2018-07-28 NOTE — Telephone Encounter (Signed)
Patient was called with Pap smear results: NIL. Had +Chlamydia however. [facility] Partner also needs to be treated. No sex x 1 week after both her and partner are treated. Recommend condoms.

## 2018-08-31 ENCOUNTER — Other Ambulatory Visit: Payer: Self-pay

## 2018-08-31 ENCOUNTER — Encounter: Payer: Self-pay | Admitting: Emergency Medicine

## 2018-08-31 ENCOUNTER — Ambulatory Visit
Admission: EM | Admit: 2018-08-31 | Discharge: 2018-08-31 | Disposition: A | Discharge status: Home / Self Care | Payer: 59 | Attending: Family Medicine | Admitting: Family Medicine

## 2018-08-31 DIAGNOSIS — R51 Headache: Secondary | ICD-10-CM | POA: Diagnosis not present

## 2018-08-31 DIAGNOSIS — R42 Dizziness and giddiness: Secondary | ICD-10-CM

## 2018-08-31 LAB — URINALYSIS, COMPLETE (UACMP) WITH MICROSCOPIC
Bilirubin Urine: NEGATIVE
GLUCOSE, UA: NEGATIVE mg/dL
HGB URINE DIPSTICK: NEGATIVE
Ketones, ur: NEGATIVE mg/dL
Leukocytes, UA: NEGATIVE
Nitrite: NEGATIVE
PROTEIN: NEGATIVE mg/dL
SPECIFIC GRAVITY, URINE: 1.025 (ref 1.005–1.030)
pH: 6 (ref 5.0–8.0)

## 2018-08-31 MED ORDER — DIAZEPAM 2 MG PO TABS: 1.0000 mg | ORAL_TABLET | Freq: Two times a day (BID) | ORAL | 0 refills | Status: DC | PRN

## 2018-08-31 MED ORDER — BUTALBITAL-APAP-CAFFEINE 50-325-40 MG PO TABS: 1.0000 | ORAL_TABLET | Freq: Four times a day (QID) | ORAL | 0 refills | Status: DC | PRN

## 2018-08-31 NOTE — ED Triage Notes (Signed)
Patient in today c/o dizziness x 1 week and she states her head feels full (headache). Patient denies fever. Patient has tried OTC motion sickness medication yesterday.

## 2018-08-31 NOTE — Discharge Instructions (Signed)
Use the medication as directed.  Rest  Take care  Dr. Adriana Simas

## 2018-08-31 NOTE — ED Provider Notes (Signed)
[facility]    CSN: [id number] Arrival date & time: 08/31/18  1608  History   Chief Complaint Chief Complaint  Patient presents with  . Dizziness   HPI  32 year old female presents with dizziness.  Patient reports that she got her hair done last Tuesday.  She states that it was a large amount of hair and it was quite heavy.  She took it out this past Tuesday.  She states that since that time she has had dizziness.  She states that she feels off balance.  She also reports associated headache which she locates to behind the nasal bridge.  Patient reports that her head feels "heavy".  She has used meclizine without improvement.  No other reported symptoms.  No others complaints or concerns at this time.  PMH, Surgical Hx, Family Hx, Social History reviewed and updated as below.  Past Medical History:  Diagnosis Date  . Bilateral chronic knee pain   . Genital herpes   . History of Papanicolaou smear of cervix 01/18/12; 05/26/16   -/-; -/- ct/gc neg;  . Obesity (BMI 35.0-39.9 without comorbidity)   . Trigeminal neuralgia pain    Patient Active Problem List   Diagnosis Date Noted  . Obesity (BMI 35.0-39.9 without comorbidity)   . Genital herpes   . Bilateral chronic knee pain   . Trigeminal neuralgia pain    Past Surgical History:  Procedure Laterality Date  . WISDOM TOOTH EXTRACTION      OB History    Gravida  2   Para  1   Term  1   Preterm      AB  1   Living  1     SAB      TAB  1   Ectopic      Multiple      Live Births  1          Home Medications    Prior to Admission medications   Medication Sig Start Date End Date Taking? Authorizing Provider  levonorgestrel (MIRENA) 20 MCG/24HR IUD 1 each by Intrauterine route once.   Yes [provider]  valACYclovir (VALTREX) 500 MG tablet Take 500 mgm BID x 3-5 days prn 03/30/18  Yes Farrel Conners, CNM  butalbital-acetaminophen-caffeine [place], ESGIC) [id number] MG tablet Take 1  tablet by mouth every 6 (six) hours as needed for headache. 08/31/18 08/31/19  Tommie Sams, DO  diazepam (VALIUM) 2 MG tablet Take 0.5 tablets (1 mg total) by mouth every 12 (twelve) hours as needed (Dizziness, vertigo). 08/31/18   Tommie Sams, DO  SUMAtriptan (IMITREX) 50 MG tablet Take 1 tablet (50 mg total) by mouth once for 1 dose. May repeat in 2 hours if headache persists or recurs. 02/06/18 08/31/18  Tommie Sams, DO   Family History Family History  Problem Relation Age of Onset  . Thyroid disease Father   . Hyperlipidemia Father   . Hypertension Father   . Lung cancer Maternal Grandmother 50  . Breast cancer Neg Hx   . Ovarian cancer Neg Hx   . Diabetes Neg Hx    Social History Social History   Tobacco Use  . Smoking status: Never Smoker  . Smokeless tobacco: Never Used  Substance Use Topics  . Alcohol use: No  . Drug use: No   Allergies   Patient has no known allergies.  Review of Systems Review of Systems  Constitutional: Negative.   Neurological: Positive for dizziness and headaches.   Physical  Exam Triage Vital Signs ED Triage Vitals  Enc Vitals Group     BP 08/31/18 1621 114/80     Pulse Rate 08/31/18 1621 83     Resp 08/31/18 1621 16     Temp 08/31/18 1621 98.8 F (37.1 C)     Temp Source 08/31/18 1621 Oral     SpO2 08/31/18 1621 100 %     Weight 08/31/18 1622 250 lb (113.4 kg)     Height 08/31/18 1622 5\' 10"  (1.778 m)     Head Circumference --      Peak Flow --      Pain Score 08/31/18 1621 8     Pain Loc --      Pain Edu? --      Excl. in GC? --    Updated Vital Signs BP 114/80 (BP Location: Left Arm)   Pulse 83   Temp 98.8 F (37.1 C) (Oral)   Resp 16   Ht 5\' 10"  (1.778 m)   Wt 113.4 kg   SpO2 100%   BMI 35.87 kg/m   Visual Acuity Right Eye Distance:   Left Eye Distance:   Bilateral Distance:    Right Eye Near:   Left Eye Near:    Bilateral Near:     Physical Exam  Constitutional: She is oriented to person, place, and time. She  appears well-developed. No distress.  HENT:  Head: Normocephalic and atraumatic.  Mouth/Throat: Oropharynx is clear and moist.  Eyes: Pupils are equal, round, and reactive to light. EOM are normal. Right eye exhibits no discharge. Left eye exhibits no discharge.  Cardiovascular: Normal rate and regular rhythm.  Pulmonary/Chest: Effort normal. No respiratory distress.  Neurological: She is alert and oriented to person, place, and time.  Psychiatric: She has a normal mood and affect. Her behavior is normal.  Nursing note and vitals reviewed.  UC Treatments / Results  Labs (all labs ordered are listed, but only abnormal results are displayed) Labs Reviewed  URINALYSIS, COMPLETE (UACMP) WITH MICROSCOPIC - Abnormal; Notable for the following components:      Result Value   APPearance HAZY (*)    Bacteria, UA FEW (*)    All other components within normal limits    EKG None  Radiology No results found.  Procedures Procedures (including critical care time)  Medications Ordered in UC Medications - No data to display  Initial Impression / Assessment and Plan / UC Course  I have reviewed the triage vital signs and the nursing notes.  Pertinent labs & imaging results that were available during my care of the patient were reviewed by me and considered in my medical decision making (see chart for details).    32 year old female presents with dizziness also has headache.  The etiology of her symptoms is unclear. Trial of valium for dizziness. Fioricet for headache.   Final Clinical Impressions(s) / UC Diagnoses   Final diagnoses:  Dizziness     Discharge Instructions     Use the medication as directed.  Rest  Take care  Dr. Adriana Simas    ED Prescriptions    Medication Sig Dispense Auth. Provider   butalbital-acetaminophen-caffeine (FIORICET, ESGIC) 50-325-40 MG tablet Take 1 tablet by mouth every 6 (six) hours as needed for headache. 20 tablet Tagen Milby G, DO   diazepam  (VALIUM) 2 MG tablet Take 0.5 tablets (1 mg total) by mouth every 12 (twelve) hours as needed (Dizziness, vertigo). 10 tablet [place], [place], [place]  Controlled Substance Prescriptions [place]  Controlled Substance Registry consulted? Not Applicable   Tommie Sams, DO 08/31/18 2009

## 2018-08-31 NOTE — ED Triage Notes (Signed)
Patient also c/o urinary frequency x 3-4 days. Patient denies pain with urination.

## 2018-12-17 ENCOUNTER — Encounter: Payer: Self-pay | Admitting: Gynecology

## 2018-12-17 ENCOUNTER — Ambulatory Visit
Admission: EM | Admit: 2018-12-17 | Discharge: 2018-12-17 | Disposition: A | Discharge status: Home / Self Care | Payer: 59 | Attending: Emergency Medicine | Admitting: Emergency Medicine

## 2018-12-17 ENCOUNTER — Other Ambulatory Visit: Payer: Self-pay

## 2018-12-17 DIAGNOSIS — J029 Acute pharyngitis, unspecified: Secondary | ICD-10-CM | POA: Diagnosis not present

## 2018-12-17 DIAGNOSIS — R05 Cough: Secondary | ICD-10-CM | POA: Diagnosis not present

## 2018-12-17 DIAGNOSIS — M7918 Myalgia, other site: Secondary | ICD-10-CM

## 2018-12-17 DIAGNOSIS — J111 Influenza due to unidentified influenza virus with other respiratory manifestations: Secondary | ICD-10-CM | POA: Insufficient documentation

## 2018-12-17 LAB — RAPID INFLUENZA A&B ANTIGENS (ARMC ONLY): INFLUENZA B (ARMC): NEGATIVE

## 2018-12-17 LAB — RAPID INFLUENZA A&B ANTIGENS: Influenza A (ARMC): NEGATIVE

## 2018-12-17 MED ORDER — IBUPROFEN 600 MG PO TABS: 600.0000 mg | ORAL_TABLET | Freq: Four times a day (QID) | ORAL | 0 refills | Status: DC | PRN

## 2018-12-17 MED ORDER — HYDROCOD POLST-CPM POLST ER 10-8 MG/5ML PO SUER: 5.0000 mL | Freq: Two times a day (BID) | ORAL | 0 refills | Status: DC | PRN

## 2018-12-17 MED ORDER — OSELTAMIVIR PHOSPHATE 75 MG PO CAPS
75.0000 mg | ORAL_CAPSULE | Freq: Two times a day (BID) | ORAL | 0 refills | Status: DC
Start: 2018-12-17 — End: 2019-02-28

## 2018-12-17 MED ORDER — BENZONATATE 200 MG PO CAPS: 200.0000 mg | ORAL_CAPSULE | Freq: Three times a day (TID) | ORAL | 0 refills | Status: DC | PRN

## 2018-12-17 MED ORDER — FLUTICASONE PROPIONATE 50 MCG/ACT NA SUSP: 2.0000 | Freq: Every day | NASAL | 0 refills | Status: DC

## 2018-12-17 NOTE — Discharge Instructions (Addendum)
Finish the Tamiflu, take ibuprofen 600 mg combined with 1 g of Tylenol together 3 or 4 times a day as needed for pain, Flonase, saline nasal irrigation with a Lloyd HugerNeil med rinse and distilled water as often as you want.  Tessalon for the cough during the day, Tussionex for the cough at night.  Push fluids.

## 2018-12-17 NOTE — ED Triage Notes (Signed)
Patient c/o cough / fever of 101 x today

## 2018-12-17 NOTE — ED Provider Notes (Signed)
HPI  SUBJECTIVE:  Sarah Bryant is a 32 y.o. female who presents with the acute onset of body aches, fevers T-max 101, clear nasal congestion, rhinorrhea, frontal sinus headache, right ear pain, cough productive of white phlegm.  Reports sore throat secondary to the cough.  She reports chest soreness from the coughing.  Some dyspnea on exertion, shortness of breath.  She reports fatigue.  She denies upper dental pain.  No wheezing.  No neck stiffness, photophobia.  She did not get a flu shot this year.  No contacts with the flu.  No antibiotics in the past month.  She took an antipyretic within 4 to 6 hours of evaluation.  She tried Alka-Seltzer cold and flu and an unknown allergy pill without improvement in her symptoms.  Aggravating factors.  Past medical history negative for asthma, eczema, COPD, smoking, diabetes, hypertension.  LMP: Amenorrheic.  Has a Mirena.  Denies the possibility being pregnant.  PMD: [facility].   Past Medical History:  Diagnosis Date  . Bilateral chronic knee pain   . Genital herpes   . History of Papanicolaou smear of cervix 01/18/12; 05/26/16   -/-; -/- ct/gc neg;  . Obesity (BMI 35.0-39.9 without comorbidity)   . Trigeminal neuralgia pain     Past Surgical History:  Procedure Laterality Date  . WISDOM TOOTH EXTRACTION      Family History  Problem Relation Age of Onset  . Thyroid disease Father   . Hyperlipidemia Father   . Hypertension Father   . Lung cancer Maternal Grandmother [id number]  . Breast cancer Neg Hx   . Ovarian cancer Neg Hx   . Diabetes Neg Hx     Social History   Tobacco Use  . Smoking status: Never Smoker  . Smokeless tobacco: Never Used  Substance Use Topics  . Alcohol use: No  . Drug use: No    No current facility-administered medications for this encounter.   Current Outpatient Medications:  .  levonorgestrel (MIRENA) 20 MCG/24HR IUD, 1 each by Intrauterine route once., Disp: , Rfl:  .  SUMAtriptan (IMITREX) 50 MG  tablet, Take 1 tablet (50 mg total) by mouth once for 1 dose. May repeat in 2 hours if headache persists or recurs., Disp: 10 tablet, Rfl: 0 .  valACYclovir (VALTREX) 500 MG tablet, Take 500 mgm BID x 3-5 days prn, Disp: 30 tablet, Rfl: 2 .  benzonatate (TESSALON) 200 MG capsule, Take 1 capsule (200 mg total) by mouth 3 (three) times daily as needed for cough., Disp: 30 capsule, Rfl: 0 .  chlorpheniramine-HYDROcodone (TUSSIONEX PENNKINETIC ER) 10-8 MG/5ML SUER, Take 5 mLs by mouth every 12 (twelve) hours as needed for cough., Disp: 60 mL, Rfl: 0 .  fluticasone (FLONASE) 50 MCG/ACT nasal spray, Place 2 sprays into both nostrils daily., Disp: 16 g, Rfl: 0 .  ibuprofen (ADVIL,MOTRIN) 600 MG tablet, Take 1 tablet (600 mg total) by mouth every 6 (six) hours as needed., Disp: 30 tablet, Rfl: 0 .  oseltamivir (TAMIFLU) 75 MG capsule, Take 1 capsule (75 mg total) by mouth 2 (two) times daily. X 5 days, Disp: 10 capsule, Rfl: 0  No Known Allergies   ROS  As noted in HPI.   Physical Exam  BP 117/77 (BP Location: Left Arm)   Pulse (!) 109   Temp 99.8 F (37.7 C) (Oral)   Resp 18   Ht 5\' 10"  (1.778 m)   Wt 111.1 kg   SpO2 98%   BMI 35.15 kg/m  Constitutional: Well developed, well nourished, no acute distress.  Appears ill Eyes: PERRL, EOMI, conjunctiva normal bilaterally HENT: Normocephalic, atraumatic,mucus membranes moist.  TMs normal bilaterally.  Positive clear rhinorrhea.  No maxillary or frontal sinus tenderness.  Erythematous oropharynx with cobblestoning, postnasal drip.  Normal tonsils.  Uvula midline. Neck: No cervical lymphadenopathy, meningismus Respiratory: Clear to auscultation bilaterally, no rales, no wheezing, no rhonchi.  No chest wall tenderness Cardiovascular: Regular tachycardia, no murmurs, no gallops, no rubs GI: Nondistended skin: No rash, skin intact Musculoskeletal: No edema, no tenderness, no deformities Neurologic: Alert & oriented x 3, CN II-XII grossly intact,  no motor deficits, sensation grossly intact Psychiatric: Speech and behavior appropriate   ED Course   Medications - No data to display  Orders Placed This Encounter  Procedures  . Rapid Influenza A&B Antigens ([facility] only)    Standing Status:   Standing    Number of Occurrences:   1  . Droplet precaution    Standing Status:   Standing    Number of Occurrences:   1   Results for orders placed or performed during the hospital encounter of 12/17/18 (from the past 24 hour(s))  Rapid Influenza A&B Antigens ([facility] only)     Status: None   Collection Time: 12/17/18  2:12 PM  Result Value Ref Range   Influenza A ([facility]) NEGATIVE NEGATIVE   Influenza B ([facility]) NEGATIVE NEGATIVE   No results found.  ED Clinical Impression  Influenza   ED Assessment/Plan  Even though her flu test is negative, clinically she has influenza.  Tamiflu, ibuprofen 600 mg to take with 1 g of Tylenol together 3 or 4 times a day as needed for pain, Flonase, saline nasal irrigation with a Lloyd HugerNeil med rinse and distilled water as often as she wants.  Tessalon for the cough during the day, Tussionex for the cough at night.  Push fluids.  Follow-up with PMD to primary care or here if not better after finishing the Tamiflu.  To the ER if she gets worse.   Narcotic database reviewed for this patient, and feel that the risk/benefit ratio today is favorable for proceeding with a prescription for controlled substance.  Discussed labs, MDM, treatment plan, and plan for follow-up with patient Discussed sn/sx that should prompt return to the ED. patient agrees with plan.   Meds ordered this encounter  Medications  . fluticasone (FLONASE) 50 MCG/ACT nasal spray    Sig: Place 2 sprays into both nostrils daily.    Dispense:  16 g    Refill:  0  . oseltamivir (TAMIFLU) 75 MG capsule    Sig: Take 1 capsule (75 mg total) by mouth 2 (two) times daily. X 5 days    Dispense:  10 capsule    Refill:  0  . ibuprofen (ADVIL,MOTRIN)  600 MG tablet    Sig: Take 1 tablet (600 mg total) by mouth every 6 (six) hours as needed.    Dispense:  30 tablet    Refill:  0  . chlorpheniramine-HYDROcodone (TUSSIONEX PENNKINETIC ER) 10-8 MG/5ML SUER    Sig: Take 5 mLs by mouth every 12 (twelve) hours as needed for cough.    Dispense:  60 mL    Refill:  0  . benzonatate (TESSALON) 200 MG capsule    Sig: Take 1 capsule (200 mg total) by mouth 3 (three) times daily as needed for cough.    Dispense:  30 capsule    Refill:  0    *This clinic  note was created using Scientist, clinical (histocompatibility and immunogenetics). Therefore, there may be occasional mistakes despite careful proofreading.  ?   Domenick Gong, MD 12/17/18 1504

## 2019-02-28 ENCOUNTER — Encounter: Payer: Self-pay | Admitting: *Deleted

## 2019-02-28 ENCOUNTER — Ambulatory Visit
Admission: EM | Admit: 2019-02-28 | Discharge: 2019-02-28 | Disposition: A | Discharge status: Home / Self Care | Payer: 59 | Attending: Family Medicine | Admitting: Family Medicine

## 2019-02-28 ENCOUNTER — Other Ambulatory Visit: Payer: Self-pay

## 2019-02-28 DIAGNOSIS — J358 Other chronic diseases of tonsils and adenoids: Secondary | ICD-10-CM | POA: Diagnosis not present

## 2019-02-28 DIAGNOSIS — H6592 Unspecified nonsuppurative otitis media, left ear: Secondary | ICD-10-CM

## 2019-02-28 DIAGNOSIS — J029 Acute pharyngitis, unspecified: Secondary | ICD-10-CM | POA: Diagnosis not present

## 2019-02-28 DIAGNOSIS — J069 Acute upper respiratory infection, unspecified: Secondary | ICD-10-CM

## 2019-02-28 MED ORDER — AMOXICILLIN 875 MG PO TABS: 875.0000 mg | ORAL_TABLET | Freq: Two times a day (BID) | ORAL | 0 refills | Status: DC

## 2019-02-28 NOTE — ED Provider Notes (Signed)
[facility] ____________________________________________  Time seen: Approximately 4:51 PM  I have reviewed the triage vital signs and the nursing notes.   HISTORY  Chief Complaint Sore Throat and Headache   HPI Sarah Bryant is a 33 y.o. female presenting for evaluation of sore throat, nasal congestion and cough.  States that she had sore throat for approximately 3 days, with only sore throat complaints.  States today started having nasal congestion, nasal drainage and cough, and states his sore throat improved.  States that she noticed a white area to the back of her throat and that she is had some bad breath.  States that she does occasionally get white bumps in her throat that she can push out and it goes away.  Denies any accompanying fever.  States her son now sick with similar.  Denies other known sick contacts.  Has been taking some over-the-counter cough congestion medication with some improvement but no resolution.  Overall continues to eat and drink well.  Denies chest pain or shortness of breath.  Reports otherwise doing well denies other complaints.  Denies current pregnancy.   Past Medical History:  Diagnosis Date  . Bilateral chronic knee pain   . Genital herpes   . History of Papanicolaou smear of cervix 01/18/12; 05/26/16   -/-; -/- ct/gc neg;  . Obesity (BMI 35.0-39.9 without comorbidity)   . Trigeminal neuralgia pain     Patient Active Problem List   Diagnosis Date Noted  . Obesity (BMI 35.0-39.9 without comorbidity)   . Genital herpes   . Bilateral chronic knee pain   . Trigeminal neuralgia pain     Past Surgical History:  Procedure Laterality Date  . WISDOM TOOTH EXTRACTION       No current facility-administered medications for this encounter.   Current Outpatient Medications:  .  amoxicillin (AMOXIL) 875 MG tablet, Take 1 tablet (875 mg total) by mouth 2 (two) times daily., Disp: 20 tablet, Rfl: 0 .  ibuprofen (ADVIL,MOTRIN) 600 MG  tablet, Take 1 tablet (600 mg total) by mouth every 6 (six) hours as needed., Disp: 30 tablet, Rfl: 0 .  levonorgestrel (MIRENA) 20 MCG/24HR IUD, 1 each by Intrauterine route once., Disp: , Rfl:  .  SUMAtriptan (IMITREX) 50 MG tablet, Take 1 tablet (50 mg total) by mouth once for 1 dose. May repeat in 2 hours if headache persists or recurs., Disp: 10 tablet, Rfl: 0 .  valACYclovir (VALTREX) 500 MG tablet, Take 500 mgm BID x 3-5 days prn, Disp: 30 tablet, Rfl: 2  Allergies Patient has no known allergies.  Family History  Problem Relation Age of Onset  . Thyroid disease Father   . Hyperlipidemia Father   . Hypertension Father   . Lung cancer Maternal Grandmother 75  . Breast cancer Neg Hx   . Ovarian cancer Neg Hx   . Diabetes Neg Hx     Social History Social History   Tobacco Use  . Smoking status: Never Smoker  . Smokeless tobacco: Never Used  Substance Use Topics  . Alcohol use: No  . Drug use: No    Review of Systems Constitutional: No fever ENT: as above.  Cardiovascular: Denies chest pain. Respiratory: Denies shortness of breath. Gastrointestinal: No abdominal pain.   Musculoskeletal: Negative for back pain. Skin: Negative for rash.  ____________________________________________   PHYSICAL EXAM:  VITAL SIGNS: ED Triage Vitals  Enc Vitals Group     BP 02/28/19 1625 123/79     Pulse Rate 02/28/19 1625  83     Resp 02/28/19 1625 17     Temp 02/28/19 1625 98.2 F (36.8 C)     Temp Source 02/28/19 1625 Oral     SpO2 02/28/19 1625 99 %     Weight --      Height --      Head Circumference --      Peak Flow --      Pain Score 02/28/19 1623 5     Pain Loc --      Pain Edu? --      Excl. in GC? --     Constitutional: Alert and oriented. Well appearing and in no acute distress. Eyes: Conjunctivae are normal. Head: Atraumatic. No sinus tenderness to palpation. No swelling. No erythema.  Ears: Right: Nontender, normal canal, no erythema, normal TM.  Left:  Nontender, normal canal, moderate erythema with effusion present.  Nose:Nasal congestion   Mouth/Throat: Mucous membranes are moist. No pharyngeal erythema. No tonsillar swelling or exudate.  Left small tonsillar stone present, no surrounding edema or erythema noted. Neck: No stridor.  No cervical spine tenderness to palpation. Hematological/Lymphatic/Immunilogical: No cervical lymphadenopathy. Cardiovascular: Normal rate, regular rhythm. Grossly normal heart sounds.  Good peripheral circulation. Respiratory: Normal respiratory effort.  No retractions. No wheezes, rales or rhonchi. Good air movement.  Musculoskeletal: Ambulatory with steady gait.  Neurologic:  Normal speech and language. No gait instability. Skin:  Skin appears warm, dry and intact. No rash noted. Psychiatric: Mood and affect are normal. Speech and behavior are normal. ___________________________________________   LABS (all labs ordered are listed, but only abnormal results are displayed)  Labs Reviewed - No data to display   PROCEDURES Procedures   INITIAL IMPRESSION / ASSESSMENT AND PLAN / ED COURSE  Pertinent labs & imaging results that were available during my care of the patient were reviewed by me and considered in my medical decision making (see chart for details).  Well-appearing patient.  No acute distress.  Suspect viral illness.  Also discussed regarding tonsillar stones, supportive care, gargles and rinsing.  Left otitis with effusion, will treat with oral amoxicillin, and encourage over-the-counter decongestants.  Rest, fluids, supportive care.  Work note given.Discussed indication, risks and benefits of medications with patient.  Discussed follow up with Primary care physician this week. Discussed follow up and return parameters including no resolution or any worsening concerns. Patient verbalized understanding and agreed to plan.   ____________________________________________   FINAL CLINICAL  IMPRESSION(S) / ED DIAGNOSES  Final diagnoses:  Left otitis media with effusion  Tonsil stone  Upper respiratory tract infection, unspecified type  Pharyngitis, unspecified etiology     ED Discharge Orders         Ordered    amoxicillin (AMOXIL) 875 MG tablet  2 times daily     02/28/19 1648           Note: This dictation was prepared with Dragon dictation along with smaller phrase technology. Any transcriptional errors that result from this process are unintentional.         Renford Dills, NP 02/28/19 1755

## 2019-02-28 NOTE — Discharge Instructions (Signed)
Take medication as prescribed. Rest. Drink plenty of fluids. Gargle warm salt water. Over the counter decongestants.   Follow up with your primary care physician this week as needed. Return to Urgent care for new or worsening concerns.

## 2019-02-28 NOTE — ED Triage Notes (Signed)
Patient reports sore throat x 3-4 days, no fever.

## 2019-03-16 ENCOUNTER — Other Ambulatory Visit: Payer: Self-pay | Admitting: Certified Nurse Midwife

## 2019-03-19 NOTE — Telephone Encounter (Signed)
Yes I already refilled this

## 2019-08-01 ENCOUNTER — Other Ambulatory Visit: Payer: Self-pay

## 2019-08-01 ENCOUNTER — Encounter: Payer: Self-pay | Admitting: Certified Nurse Midwife

## 2019-08-01 ENCOUNTER — Other Ambulatory Visit (HOSPITAL_COMMUNITY)
Admission: RE | Admit: 2019-08-01 | Discharge: 2019-08-01 | Disposition: A | Discharge status: Home / Self Care | Payer: 59 | Source: Ambulatory Visit | Attending: Certified Nurse Midwife | Admitting: Certified Nurse Midwife

## 2019-08-01 ENCOUNTER — Ambulatory Visit (INDEPENDENT_AMBULATORY_CARE_PROVIDER_SITE_OTHER): Payer: 59 | Admitting: Certified Nurse Midwife

## 2019-08-01 VITALS — BP 120/76 | HR 78 | Ht 70.0 in | Wt 261.2 lb

## 2019-08-01 DIAGNOSIS — Z01419 Encounter for gynecological examination (general) (routine) without abnormal findings: Secondary | ICD-10-CM | POA: Diagnosis not present

## 2019-08-01 DIAGNOSIS — Z124 Encounter for screening for malignant neoplasm of cervix: Secondary | ICD-10-CM | POA: Diagnosis present

## 2019-08-01 DIAGNOSIS — Z113 Encounter for screening for infections with a predominantly sexual mode of transmission: Secondary | ICD-10-CM

## 2019-08-01 MED ORDER — VALACYCLOVIR HCL 500 MG PO TABS: ORAL_TABLET | ORAL | 2 refills | Status: DC

## 2019-08-01 NOTE — Progress Notes (Signed)
Gynecology Annual Exam  PCP: [facility] in [place] Chief Complaint:  Chief Complaint  Patient presents with  . Gynecologic Exam    History of Present Illness: Sarah Bryant is a 33 y.o. BF, G2P1011, who  presents for her annual exam. The patient has no significant gyn complaints Her menses are absent due to her Mirena IUD. Her Mirena was replaced 03/30/2018. Last pap smear: 07/25/2018, results were NIL with positive Chlamydia screen. She was treated with Azithromycin. She desires STD testing today.  Since her last visit 07/25/2018, she has gained 11#. She reports that she has a bruise on her left hip that has never gone a way and that her left hip/thigh is larger in size compared with the right side. The bruise is not painful. She also complains of swelling in her lower extremities x 2 weeks  Her past medical history is remarkable for HSVII (uses Valtrex episodically), trigeminal neuralgia, obesity.   The patient does not perform self breast exams. Her last mammogram was NA  There is no family history of breast cancer.    There is no family history of ovarian cancer.   The patient denies smoking.  She denies drinking. alcohol  She denies illegal drug use.  The patient has not been exercising recently. Current BMI=37.48 kg/m2  The patient gets adequate calcium in her diet.  Her last cholesterol screen was in 2016 and was  Borderline (T chol 201) and her last diabetes screen was 2018 and her hemoglobin A1C was 5.5%  The patient denies current symptoms of depression.    Review of Systems: Review of Systems  Constitutional: Negative for chills, fever and weight loss.  HENT: Negative for congestion, sinus pain and sore throat.   Eyes: Negative for blurred vision and pain.  Respiratory: Negative for hemoptysis, shortness of breath and wheezing.   Cardiovascular: Positive for leg swelling. Negative for chest pain and palpitations.  Gastrointestinal: Negative for  abdominal pain, blood in stool, diarrhea, heartburn, nausea and vomiting.  Genitourinary: Negative for dysuria, frequency, hematuria and urgency.       Positive for amenorrhea  Musculoskeletal: Positive for joint pain (bilateral knee pain). Negative for back pain and myalgias.  Skin: Negative for itching and rash.       Positive for a purplish bruise on the left hip  Neurological: Negative for dizziness, tingling and headaches.  Endo/Heme/Allergies: Negative for environmental allergies and polydipsia. Does not bruise/bleed easily.       Negative for hirsutism   Psychiatric/Behavioral: Negative for depression. The patient is not nervous/anxious and does not have insomnia.     Past Medical History:  Past Medical History:  Diagnosis Date  . Bilateral chronic knee pain   . Genital herpes   . History of Papanicolaou smear of cervix 01/18/12; 05/26/16   -/-; -/- ct/gc neg;  . Obesity (BMI 35.0-39.9 without comorbidity)   . Trigeminal neuralgia pain     Past Surgical History:  Past Surgical History:  Procedure Laterality Date  . WISDOM TOOTH EXTRACTION      Family History:  Family History  Problem Relation Age of Onset  . Thyroid disease Father   . Hyperlipidemia Father   . Hypertension Father   . Lung cancer Maternal Grandmother [id number]  . Breast cancer Neg Hx   . Ovarian cancer Neg Hx   . Diabetes Neg Hx     Social History:  Social History   Socioeconomic History  . Marital status: Single  Spouse name: Not on file  . Number of children: 1  . Years of education: 69  . Highest education level: Not on file  Occupational History  . Occupation: Dance movement psychotherapist    Comment: Mining engineer  Social Needs  . Financial resource strain: Not on file  . Food insecurity    Worry: Not on file    Inability: Not on file  . Transportation needs    Medical: Not on file    Non-medical: Not on file  Tobacco Use  . Smoking status: Never Smoker  . Smokeless tobacco: Never Used   Substance and Sexual Activity  . Alcohol use: No  . Drug use: No  . Sexual activity: Yes    Partners: Male    Birth control/protection: I.U.D.  Lifestyle  . Physical activity    Days per week: 0 days    Minutes per session: 0 min  . Stress: Not at all  Relationships  . Social Herbalist on phone: Not on file    Gets together: Not on file    Attends religious service: Not on file    Active member of club or organization: Not on file    Attends meetings of clubs or organizations: Not on file    Relationship status: Not on file  . Intimate partner violence    Fear of current or ex partner: Not on file    Emotionally abused: Not on file    Physically abused: Not on file    Forced sexual activity: Not on file  Other Topics Concern  . Not on file  Social History Narrative  . Not on file    Allergies:  No Known Allergies  Medications: Prior to Admission medications   Medication Sig Start Date End Date Taking? Authorizing Provider  levonorgestrel (MIRENA) 20 MCG/24HR IUD 1 each by Intrauterine route once.   Yes [provider]   Valtrex 500 mgm BID x 3-5 days prn outbreak  Physical Exam Vitals:BP 120/76   Pulse 78   Ht 5\' 10"  (1.778 m)   Wt 261 lb 3.2 oz (118.5 kg)   LMP  (LMP Unknown)   BMI 37.48 kg/m   General: BF in NAD HEENT: normocephalic, anicteric Neck: no thyroid enlargement, no palpable nodules, no cervical lymphadenopathy  Pulmonary: No increased work of breathing, CTAB Cardiovascular: RRR, without murmur  Breast: Breast symmetrical, no tenderness, no palpable nodules or masses, no skin or nipple retraction present, no nipple discharge.  No axillary, infraclavicular or supraclavicular lymphadenopathy. Abdomen: Soft, obese, non-tender, non-distended.  Umbilicus without lesions. Tatoos on lower abdomen. No hepatomegaly or masses palpable. No evidence of hernia. Genitourinary:  External: Normal external female genitalia.  Normal urethral  meatus, normal Bartholin's and Skene's glands.    Vagina: Normal vaginal mucosa, no evidence of prolapse, small amount clear discharge  Cervix: Grossly normal in appearance, anterior, no bleeding, non-tender, IUD strings present  Uterus: Retroverted, normal size, shape, and consistency, decreased mobility, and non-tender  Adnexa: No adnexal masses, non-tender  Rectal: deferred  Lymphatic: no evidence of inguinal lymphadenopathy Extremities: trace pitting edema in lower extremities, erythema, or tenderness Neurologic: Grossly intact Psychiatric: mood appropriate, affect full    Assessment: 33 y.o. G2P1011 annual gyn exam History of HSV II Edema of lower extremities-no CP, SOB. Encouraged decreasing salt intake and increasing water intake, elevate legs when possible Bruise on left hip-recommend seeing dermatologist for further evaluation   Plan:   1) Breast cancer screening - recommend monthly  self breast exam.   2) STI screening was done including RPR, HIV, GC and Chlamydia testing  3) Cervical cancer screening - Pap was done. ASCCP guidelines and rational discussed.  Patient opts for yearly screening interval  4) Contraception- Mirena placed 2019  5) Routine healthcare maintenance labs UTD. -repeat next year.   6) RTO in 1 year.   Farrel Connersolleen Gilmer Kaminsky, CNM

## 2019-08-02 LAB — RPR QUALITATIVE: RPR Ser Ql: NONREACTIVE

## 2019-08-02 LAB — HIV ANTIBODY (ROUTINE TESTING W REFLEX): HIV Screen 4th Generation wRfx: NONREACTIVE

## 2019-08-06 LAB — CYTOLOGY - PAP
Chlamydia: NEGATIVE
Diagnosis: NEGATIVE
HPV: NOT DETECTED
Neisseria Gonorrhea: NEGATIVE
Trichomonas: NEGATIVE

## 2020-02-11 NOTE — Telephone Encounter (Signed)
Mirena Removal/insert & device charged 03/30/18

## 2020-04-10 ENCOUNTER — Telehealth: Payer: Self-pay

## 2020-04-10 MED ORDER — VALACYCLOVIR HCL 500 MG PO TABS: ORAL_TABLET | ORAL | 2 refills | Status: DC

## 2020-04-10 NOTE — Telephone Encounter (Signed)
LMVM TRC. Advised patient CLG is with patients. Requested patient call back with details of what she would like to discuss w/CLG.

## 2020-04-10 NOTE — Telephone Encounter (Signed)
Pt called needing a refill of her valtrex sent to [address], refill sent, pt aware

## 2020-04-10 NOTE — Telephone Encounter (Signed)
Patient requesting return call from CLG. VT#[phone]

## 2020-05-30 ENCOUNTER — Other Ambulatory Visit: Payer: Self-pay | Admitting: Certified Nurse Midwife

## 2020-07-04 ENCOUNTER — Other Ambulatory Visit: Payer: Self-pay | Admitting: Certified Nurse Midwife

## 2020-07-04 MED ORDER — VALACYCLOVIR HCL 500 MG PO TABS: ORAL_TABLET | ORAL | 1 refills | Status: DC

## 2020-07-16 ENCOUNTER — Ambulatory Visit
Admission: EM | Admit: 2020-07-16 | Discharge: 2020-07-16 | Disposition: A | Discharge status: Home / Self Care | Payer: 59 | Attending: Family Medicine | Admitting: Family Medicine

## 2020-07-16 ENCOUNTER — Other Ambulatory Visit: Payer: Self-pay

## 2020-07-16 ENCOUNTER — Encounter: Payer: Self-pay | Admitting: Emergency Medicine

## 2020-07-16 DIAGNOSIS — K1120 Sialoadenitis, unspecified: Secondary | ICD-10-CM | POA: Diagnosis not present

## 2020-07-16 MED ORDER — AMOXICILLIN-POT CLAVULANATE 875-125 MG PO TABS: 1.0000 | ORAL_TABLET | Freq: Two times a day (BID) | ORAL | 0 refills | Status: DC

## 2020-07-16 NOTE — ED Triage Notes (Signed)
Pt c/o right sided jaw pain and swelling. No dental pain. Started yesterday. Denies fever.

## 2020-07-16 NOTE — ED Provider Notes (Signed)
[facility] ____________________________________________  Time seen: Approximately 12:37 PM  I have reviewed the triage vital signs and the nursing notes.   HISTORY  Chief Complaint Jaw Pain  HPI Sarah Bryant is a 34 y.o. female presenting for evaluation of right lower jaw swelling and tenderness present since yesterday.  States noticed some increased swelling today prompting her to come in.  Denies dental pain, gum pain or swelling.  Denies ear pain.  Does not grind her teeth.  Denies fall, injury, skin changes or break in skin.  Continues eat and drink well.  Denies any difficulty swallowing, oral swelling, paresthesias, or recent sickness.  No recent cough, congestion, sore throat or fevers.  Denies history of the same.  Reports up-to-date on childhood vaccines, including MMR.  Denies aggravating alleviating factors.  Reports otherwise doing well.    Past Medical History:  Diagnosis Date   Bilateral chronic knee pain    Genital herpes    History of Papanicolaou smear of cervix 01/18/12; 05/26/16   -/-; -/- ct/gc neg;   Obesity (BMI 35.0-39.9 without comorbidity)    Trigeminal neuralgia pain     Patient Active Problem List   Diagnosis Date Noted   Obesity (BMI 35.0-39.9 without comorbidity)    Genital herpes    Bilateral chronic knee pain    Trigeminal neuralgia pain     Past Surgical History:  Procedure Laterality Date   WISDOM TOOTH EXTRACTION       No current facility-administered medications for this encounter.  Current Outpatient Medications:    carbamazepine (TEGRETOL XR) 200 MG 12 hr tablet, Take 200 mg by mouth 2 (two) times daily., Disp: , Rfl:    levonorgestrel (MIRENA) 20 MCG/24HR IUD, 1 each by Intrauterine route once., Disp: , Rfl:    valACYclovir (VALTREX) 500 MG tablet, Take one tablet daily, Disp: 90 tablet, Rfl: 1   amoxicillin-clavulanate (AUGMENTIN) 875-125 MG tablet, Take 1 tablet by mouth every 12 (twelve) hours.,  Disp: 20 tablet, Rfl: 0  Allergies Patient has no known allergies.  Family History  Problem Relation Age of Onset   Thyroid disease Father    Hyperlipidemia Father    Hypertension Father    Lung cancer Maternal Grandmother 39   Breast cancer Neg Hx    Ovarian cancer Neg Hx    Diabetes Neg Hx     Social History Social History   Tobacco Use   Smoking status: Never Smoker   Smokeless tobacco: Never Used  Scientific laboratory technician Use: Never used  Substance Use Topics   Alcohol use: No   Drug use: No    Review of Systems Constitutional: No fever ENT: No sore throat. As above.  Cardiovascular: Denies chest pain. Respiratory: Denies shortness of breath. Skin: Negative for rash. Neurological: Negative for focal weakness or numbness.    ____________________________________________   PHYSICAL EXAM:  VITAL SIGNS: ED Triage Vitals  Enc Vitals Group     BP 07/16/20 1125 113/81     Pulse Rate 07/16/20 1125 82     Resp 07/16/20 1125 18     Temp 07/16/20 1125 98.3 F (36.8 C)     Temp Source 07/16/20 1125 Oral     SpO2 07/16/20 1125 97 %     Weight 07/16/20 1122 261 lb 3.9 oz (118.5 kg)     Height 07/16/20 1122 _0  (1.778 m)     Head Circumference --      Peak Flow --  Pain Score 07/16/20 1122 3     Pain Loc --      Pain Edu? --      Excl. in [place]? --     Constitutional: Alert and oriented. Well appearing and in no acute distress. Eyes: Conjunctivae are normal.  ENT      Head: Normocephalic.  No TMJ tenderness bilaterally.      Ears: Left: Nontender, normal canal, no erythema, normal TM.  Right: Nontender, normal canal, no erythema, normal TM.      Nose: No congestion/rhinnorhea.      Mouth/Throat: Mucous membranes are moist. Oropharynx non-erythematous.  No tonsillar exudate.  No gumline tenderness, edema or erythema noted.  No dental tenderness.  No oral drainage. Neck: No stridor. Supple without meningismus.  Right submandibular gland mild  swelling and mild tenderness to direct palpation, no erythema, no fluctuance, no skin changes. Hematological/Lymphatic/Immunilogical: No cervical lymphadenopathy. Cardiovascular: Normal rate, regular rhythm. Grossly normal heart sounds.  Good peripheral circulation. Respiratory: Normal respiratory effort without tachypnea nor retractions. Breath sounds are clear and equal bilaterally. No wheezes, rales, rhonchi. Musculoskeletal:  Steady gait.  Neurologic:  Normal speech and language. Speech is normal. No gait instability.  Skin:  Skin is warm, dry and intact. No rash noted. Psychiatric: Mood and affect are normal. Speech and behavior are normal. Patient exhibits appropriate insight and judgment   ___________________________________________   LABS (all labs ordered are listed, but only abnormal results are displayed)  Labs Reviewed - No data to display ____________________________________________  PROCEDURES Procedures    INITIAL IMPRESSION / ASSESSMENT AND PLAN / ED COURSE  Pertinent labs & imaging results that were available during my care of the patient were reviewed by me and considered in my medical decision making (see chart for details).  Well-appearing patient.  No acute distress.  Discussed multiple differentials including otitis, dental infection, TMJ, sialoadenitis, submandibular gland infection.  Suspect submandibular plan infection versus sialoadenitis.  Will treat with oral Augmentin, use over-the-counter ibuprofen, sour lozenges, supportive care and monitoring.  Discussed her follow-up and return parameters.Discussed indication, risks and benefits of medications with patient.   Discussed follow up with Primary care physician this week. Discussed follow up and return parameters including no resolution or any worsening concerns. Patient verbalized understanding and agreed to plan.   ____________________________________________   FINAL CLINICAL IMPRESSION(S) / ED  DIAGNOSES  Final diagnoses:  Submandibular gland inflammation     ED Discharge Orders         Ordered    amoxicillin-clavulanate (AUGMENTIN) 875-125 MG tablet  Every 12 hours     Discontinue  Reprint     07/16/20 1144           Note: This dictation was prepared with Dragon dictation along with smaller phrase technology. Any transcriptional errors that result from this process are unintentional.         Marylene Land, NP 07/16/20 1301

## 2020-07-16 NOTE — Discharge Instructions (Addendum)
Take medication as prescribed.  Over-the-counter ibuprofen.  Sour lozenges.  Monitor closely.  Follow up with your primary care physician or ear nose and throat(see above) as needed for continued complaints.  Return to Urgent care or ER for increased swelling, fever, pain, difficulty swallowing, new or worsening concerns.

## 2020-07-21 ENCOUNTER — Ambulatory Visit
Admission: RE | Admit: 2020-07-21 | Discharge: 2020-07-21 | Disposition: A | Discharge status: Home / Self Care | Payer: 59 | Source: Ambulatory Visit | Attending: Family Medicine | Admitting: Family Medicine

## 2020-07-21 ENCOUNTER — Other Ambulatory Visit: Payer: Self-pay

## 2020-07-21 VITALS — BP 119/81 | HR 68 | Temp 98.0°F | Resp 18 | Ht 70.0 in | Wt 240.0 lb

## 2020-07-21 DIAGNOSIS — R591 Generalized enlarged lymph nodes: Secondary | ICD-10-CM | POA: Diagnosis not present

## 2020-07-21 MED ORDER — PREDNISONE 10 MG PO TABS: ORAL_TABLET | ORAL | 0 refills | Status: DC

## 2020-07-21 NOTE — ED Triage Notes (Signed)
Patient here for jaw swelling that started this morning. She was seen 5 days ago for the same symptoms but has developed a new knot under her chin. She has been taking the antibiotics that were prescribed.

## 2020-07-21 NOTE — ED Provider Notes (Signed)
[facility] ____________________________________________  Time seen: Approximately 1:49 PM  I have reviewed the triage vital signs and the nursing notes.   HISTORY  Chief Complaint Appointment and Facial Swelling  HPI Sarah Bryant is a 34 y.o. female presenting for evaluation of knot in neck.  Patient reports she was seen in urgent care for the same complaint on 07/16/2020 and treated with oral Augmentin.  States that time she had swelling to her right lower jaw, and it was tender, and reports since taking the antibiotic it is not as tender, still some swelling.  States she woke up this morning and noticed some swelling underneath her chin.  Denies any redness, skin changes, break in skin.  Denies any sore throat, dental pain, gum swelling, difficulty swallowing, sore throat or shortness of breath.  Denies fevers, cough or congestion or recent sickness.  Continues eat and drink well.  States does occasionally notice a bad odor in her mouth.  Has been taking Augmentin.  Denies other aggravating or alleviating factors.  Patient does have chronic trigeminal neuralgia, denies any acute changes in this.  Reports otherwise doing well.  Denies other changes.    Past Medical History:  Diagnosis Date   Bilateral chronic knee pain    Genital herpes    History of Papanicolaou smear of cervix 01/18/12; 05/26/16   -/-; -/- ct/gc neg;   Obesity (BMI 35.0-39.9 without comorbidity)    Trigeminal neuralgia pain     Patient Active Problem List   Diagnosis Date Noted   Obesity (BMI 35.0-39.9 without comorbidity)    Genital herpes    Bilateral chronic knee pain    Trigeminal neuralgia pain     Past Surgical History:  Procedure Laterality Date   WISDOM TOOTH EXTRACTION       No current facility-administered medications for this encounter.  Current Outpatient Medications:    amoxicillin-clavulanate (AUGMENTIN) 875-125 MG tablet, Take 1 tablet by mouth every 12  (twelve) hours., Disp: 20 tablet, Rfl: 0   carbamazepine (TEGRETOL XR) 200 MG 12 hr tablet, Take 200 mg by mouth 2 (two) times daily., Disp: , Rfl:    levonorgestrel (MIRENA) 20 MCG/24HR IUD, 1 each by Intrauterine route once., Disp: , Rfl:    valACYclovir (VALTREX) 500 MG tablet, Take one tablet daily, Disp: 90 tablet, Rfl: 1   predniSONE (DELTASONE) 10 MG tablet, Start 60 mg po day one, then 50 mg po day two, taper by 10 mg daily until complete., Disp: 21 tablet, Rfl: 0  Allergies Patient has no known allergies.  Family History  Problem Relation Age of Onset   Thyroid disease Father    Hyperlipidemia Father    Hypertension Father    Lung cancer Maternal Grandmother 71   Breast cancer Neg Hx    Ovarian cancer Neg Hx    Diabetes Neg Hx     Social History Social History   Tobacco Use   Smoking status: Never Smoker   Smokeless tobacco: Never Used  Building services engineer Use: Never used  Substance Use Topics   Alcohol use: No   Drug use: No    Review of Systems Constitutional: No fever/chills Eyes: No visual changes. ENT: No sore throat. As above.  Cardiovascular: Denies chest pain. Respiratory: Denies shortness of breath. Gastrointestinal: No abdominal pain.  No nausea, no vomiting.  No diarrhea.  Skin: Negative for rash.   ____________________________________________   PHYSICAL EXAM:  VITAL SIGNS: ED Triage Vitals  Enc Vitals Group  BP 07/21/20 1225 119/81     Pulse Rate 07/21/20 1225 68     Resp 07/21/20 1225 18     Temp 07/21/20 1225 98 F (36.7 C)     Temp Source 07/21/20 1225 Oral     SpO2 07/21/20 1225 98 %     Weight 07/21/20 1224 (!) 240 lb (108.9 kg)     Height 07/21/20 1224 5\' 10"  (1.778 m)     Head Circumference --      Peak Flow --      Pain Score 07/21/20 1223 0     Pain Loc --      Pain Edu? --      Excl. in [place]? --     Constitutional: Alert and oriented. Well appearing and in no acute distress. Eyes: Conjunctivae are  normal. PERRL. EOMI. ENT      Head: Normocephalic.       Ears: Nontender, normal canal, no erythema, normal TM bilaterally.      Nose: No congestion.       Mouth/Throat: Mucous membranes are moist.Oropharynx non-erythematous.  No tonsillar swelling or exudate.  No gumline or dental tenderness or swelling.  No drainage noted.  Swallowing well. Neck: No stridor. Supple without meningismus.  Hematological/Lymphatic/Immunilogical: Right submandibular gland mild swelling and mild tenderness to palpation, submental mild swelling and tenderness noted, nonerythematous, other cervical lymphadenopathy or supraclavicular lymphadenopathy palpated. Cardiovascular: Normal rate, regular rhythm. Grossly normal heart sounds.  Good peripheral circulation. Respiratory: Normal respiratory effort without tachypnea nor retractions. Breath sounds are clear and equal bilaterally. No wheezes, rales, rhonchi. Musculoskeletal:  Steady gait.  Neurologic:  Normal speech and language.  Speech is normal. No gait instability.  Skin:  Skin is warm, dry and intact. No rash noted. Psychiatric: Mood and affect are normal. Speech and behavior are normal. Patient exhibits appropriate insight and judgment   ___________________________________________   LABS (all labs ordered are listed, but only abnormal results are displayed)  Labs Reviewed - No data to display ____________________________________________   PROCEDURES Procedures   INITIAL IMPRESSION / ASSESSMENT AND PLAN / ED COURSE  Pertinent labs & imaging results that were available during my care of the patient were reviewed by me and considered in my medical decision making (see chart for details). Discussed patient and plan of care with Dr. 07/23/20, who agrees with plan.  Very well-appearing patient.  No acute distress.  Currently on Augmentin for the last 5 days.  Discussed with patient submandibular gland infection, sialoadenitis, oral dental infection, viral  illness.  Recommend continue Augmentin and will treat with prednisone course.  Encourage follow-up with ENT.  Discussed strict sooner return follow-up parameters. Discussed indication, risks and benefits of medications with patient.   Discussed follow up with Primary care physician this week. Discussed follow up and return parameters including no resolution or any worsening concerns. Patient verbalized understanding and agreed to plan.   ____________________________________________   FINAL CLINICAL IMPRESSION(S) / ED DIAGNOSES  Final diagnoses:  Lymphadenopathy     ED Discharge Orders         Ordered    predniSONE (DELTASONE) 10 MG tablet     Discontinue  Reprint     07/21/20 1330           Note: This dictation was prepared with Dragon dictation along with smaller phrase technology. Any transcriptional errors that result from this process are unintentional.         07/23/20, NP 07/21/20 [id number]

## 2020-07-21 NOTE — Discharge Instructions (Signed)
Take medication as prescribed. Rest. Drink plenty of fluids.  Continue current antibiotics.  Sour lozenges.  Follow-up with ear nose and throat.  See above to call.   Follow up with your primary care physician this week as needed. Return to Urgent care for new or worsening concerns.

## 2020-08-07 ENCOUNTER — Ambulatory Visit
Admission: EM | Admit: 2020-08-07 | Discharge: 2020-08-07 | Disposition: A | Discharge status: Home / Self Care | Payer: 59

## 2020-08-07 ENCOUNTER — Telehealth: Payer: Self-pay | Admitting: Family Medicine

## 2020-08-07 MED ORDER — CARBAMAZEPINE 200 MG PO TABS: 200.0000 mg | ORAL_TABLET | Freq: Three times a day (TID) | ORAL | 0 refills | Status: DC

## 2020-08-07 NOTE — Telephone Encounter (Signed)
Patient needing Rx refill.  Rx for Tegretol sent.  Everlene Other DO [facility]

## 2020-08-12 NOTE — Progress Notes (Signed)
Gynecology Annual Exam  PCP: [facility] in [place] Chief Complaint:  Chief Complaint  Patient presents with  . Gynecologic Exam    ck IUD strings - bothering partner    History of Present Illness: Sarah Bryant is a 34 y.o. BF, G2P1011, who  presents for her annual exam. The patient has no significant gyn complaints Her menses are absent due to her Mirena IUD. Her Mirena was replaced 03/30/2018. Last pap smear: 08/01/2019 results were NIL/ negative HRHPV. She desires STD testing today.  Since her last visit 08/01/2019, she has lost 8#. 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  The patient does not perform self breast exams. Her last mammogram was NA  There is no family history of breast cancer.    There is no family history of ovarian cancer.   The patient denies smoking.  She denies drinking. alcohol  She denies illegal drug use.  The patient has not been exercising recently. Current BMI=36.3 kg/m2  The patient may get adequate calcium in her diet. She has not been taking vitamins  Her last cholesterol screen was in 2016 and was  Borderline (T chol 201) and her last diabetes screen was 2018 and her hemoglobin A1C was 5.5%  The patient denies current symptoms of depression.    Review of Systems: Review of Systems  Constitutional: Negative for chills, fever and weight loss.  HENT: Negative for congestion, sinus pain and sore throat.   Eyes: Negative for blurred vision and pain.  Respiratory: Negative for hemoptysis, shortness of breath and wheezing.   Cardiovascular: Negative for chest pain, palpitations and leg swelling.  Gastrointestinal: Negative for abdominal pain, blood in stool, diarrhea, heartburn, nausea and vomiting.  Genitourinary: Negative for dysuria, frequency, hematuria and urgency.       Positive for  amenorrhea  Musculoskeletal: Negative for back pain, joint pain and myalgias.  Skin: Negative for itching and rash.  Neurological: Negative for dizziness, tingling and headaches.  Endo/Heme/Allergies: Negative for environmental allergies and polydipsia. Does not bruise/bleed easily.          Psychiatric/Behavioral: Negative for depression. The patient is not nervous/anxious and does not have insomnia.     Past Medical History:  Past Medical History:  Diagnosis Date  . Bilateral chronic knee pain   . Genital herpes   . History of Papanicolaou smear of cervix 01/18/12; 05/26/16   -/-; -/- ct/gc neg;  . Obesity (BMI 35.0-39.9 without comorbidity)   . Trigeminal neuralgia pain     Past Surgical History:  Past Surgical History:  Procedure Laterality Date  . WISDOM TOOTH EXTRACTION      Family History:  Family History  Problem Relation Age of Onset  . Thyroid disease Father   . Hyperlipidemia Father   . Hypertension Father   . Lung cancer Maternal Grandmother 52  . Breast cancer Neg Hx   . Ovarian cancer Neg Hx   . Diabetes Neg Hx     Social History:  Social History   Socioeconomic History  . Marital status: Single    Spouse name: Not on file  . Number of children: 1  . Years of education: 6  . Highest education level: Not on file  Occupational History  . Occupation: Social research officer, government    Comment: [employer]  Tobacco Use  . Smoking status: Never Smoker  .  Smokeless tobacco: Never Used  Vaping Use  . Vaping Use: Never used  Substance and Sexual Activity  . Alcohol use: No  . Drug use: No  . Sexual activity: Yes    Partners: Male    Birth control/protection: I.U.D.  Other Topics Concern  . Not on file  Social History Narrative  . Not on file   Social Determinants of Health   Financial Resource Strain:   . Difficulty of Paying Living Expenses:   Food Insecurity:   . Worried About Programme researcher, broadcasting/film/video in the Last Year:   . Barista in the  Last Year:   Transportation Needs:   . Freight forwarder (Medical):   Marland Kitchen Lack of Transportation (Non-Medical):   Physical Activity:   . Days of Exercise per Week:   . Minutes of Exercise per Session:   Stress:   . Feeling of Stress :   Social Connections:   . Frequency of Communication with Friends and Family:   . Frequency of Social Gatherings with Friends and Family:   . Attends Religious Services:   . Active Member of Clubs or Organizations:   . Attends Banker Meetings:   Marland Kitchen Marital Status:   Intimate Partner Violence:   . Fear of Current or Ex-Partner:   . Emotionally Abused:   Marland Kitchen Physically Abused:   . Sexually Abused:     Allergies:  No Known Allergies  Medications:  Current Outpatient Medications:  .  carbamazepine (TEGRETOL) 200 MG tablet, Take 1 tablet (200 mg total) by mouth 3 (three) times daily., Disp: 90 tablet, Rfl: 0 .  levonorgestrel (MIRENA) 20 MCG/24HR IUD, 1 each by Intrauterine route once., Disp: , Rfl:  .  valACYclovir (VALTREX) 500 MG tablet, Take one tablet daily, Disp: 90 tablet, Rfl: 1 Physical Exam Vitals:BP 135/82   Pulse 81   Ht 5\' 10"  (1.778 m)   Wt 253 lb (114.8 kg)   LMP  (LMP Unknown)   BMI 36.30 kg/m   General: BF in NAD HEENT: normocephalic, anicteric Neck: no thyroid enlargement, no palpable nodules, no cervical lymphadenopathy. Acanthosis nigricans is present around neck Pulmonary: No increased work of breathing, CTAB Cardiovascular: RRR, without murmur  Breast: Breast symmetrical, no tenderness, no palpable nodules or masses, no skin or nipple retraction present, no nipple discharge.  No axillary, infraclavicular or supraclavicular lymphadenopathy. Abdomen: Soft, obese, non-tender, non-distended.  Umbilicus without lesions. Tatoos on lower abdomen. No hepatomegaly or masses palpable. No evidence of hernia. Genitourinary:  External: Normal external female genitalia.  Normal urethral meatus, normal Bartholin's and  Skene's glands.    Vagina: Normal vaginal mucosa, no evidence of prolapse, small amount clear discharge  Cervix: Grossly normal in appearance, anterior, no bleeding, non-tender, IUD strings present  Uterus: Retroverted, normal size, shape, and consistency, decreased mobility, and non-tender  Adnexa: No adnexal masses, non-tender  Rectal: deferred  Lymphatic: no evidence of inguinal lymphadenopathy Extremities: trace pitting edema in lower extremities, erythema, or tenderness Neurologic: Grossly intact Psychiatric: mood appropriate, affect full    Assessment: 34 y.o. G2P1011 annual gyn exam History of HSV II  Plan:   1) Breast cancer screening - recommend monthly self breast exam.   2) STI screening was done including RPR, HIV, GC and Chlamydia testing  3) Cervical cancer screening - Pap was done. ASCCP guidelines and rational discussed.  Patient opts for yearly screening interval  4) Contraception- Mirena placed 2019  5) Routine healthcare maintenance: lipid panel, hemoglobin A1C  Encouraged calcium and vitamin D supplementation. TDAP given today  6) RTO in 1 year.   Farrel Conners, CNM

## 2020-08-13 ENCOUNTER — Other Ambulatory Visit (HOSPITAL_COMMUNITY)
Admission: RE | Admit: 2020-08-13 | Discharge: 2020-08-13 | Disposition: A | Discharge status: Home / Self Care | Payer: 59 | Source: Ambulatory Visit | Attending: Certified Nurse Midwife | Admitting: Certified Nurse Midwife

## 2020-08-13 ENCOUNTER — Other Ambulatory Visit: Payer: Self-pay

## 2020-08-13 ENCOUNTER — Ambulatory Visit (INDEPENDENT_AMBULATORY_CARE_PROVIDER_SITE_OTHER): Payer: 59 | Admitting: Certified Nurse Midwife

## 2020-08-13 ENCOUNTER — Encounter: Payer: Self-pay | Admitting: Certified Nurse Midwife

## 2020-08-13 VITALS — BP 135/82 | HR 81 | Ht 70.0 in | Wt 253.0 lb

## 2020-08-13 DIAGNOSIS — Z113 Encounter for screening for infections with a predominantly sexual mode of transmission: Secondary | ICD-10-CM

## 2020-08-13 DIAGNOSIS — Z124 Encounter for screening for malignant neoplasm of cervix: Secondary | ICD-10-CM | POA: Insufficient documentation

## 2020-08-13 DIAGNOSIS — Z131 Encounter for screening for diabetes mellitus: Secondary | ICD-10-CM

## 2020-08-13 DIAGNOSIS — Z23 Encounter for immunization: Secondary | ICD-10-CM | POA: Diagnosis not present

## 2020-08-13 DIAGNOSIS — Z13220 Encounter for screening for lipoid disorders: Secondary | ICD-10-CM | POA: Diagnosis not present

## 2020-08-14 LAB — RPR: RPR Ser Ql: NONREACTIVE

## 2020-08-14 LAB — HEMOGLOBIN A1C
Est. average glucose Bld gHb Est-mCnc: 117 mg/dL
Hgb A1c MFr Bld: 5.7 % — ABNORMAL HIGH (ref 4.8–5.6)

## 2020-08-14 LAB — LIPID PANEL WITH LDL/HDL RATIO
Cholesterol, Total: 270 mg/dL — ABNORMAL HIGH (ref 100–199)
HDL: 58 mg/dL (ref >39)
LDL Chol Calc (NIH): 196 mg/dL — ABNORMAL HIGH (ref 0–99)
LDL/HDL Ratio: 3.4 ratio — ABNORMAL HIGH (ref 0.0–3.2)
Triglycerides: 93 mg/dL (ref 0–149)
VLDL Cholesterol Cal: 16 mg/dL (ref 5–40)

## 2020-08-14 LAB — HIV ANTIBODY (ROUTINE TESTING W REFLEX): HIV Screen 4th Generation wRfx: NONREACTIVE

## 2020-08-15 LAB — CYTOLOGY - PAP
Chlamydia: NEGATIVE
Comment: NEGATIVE
Comment: NEGATIVE
Comment: NORMAL
Diagnosis: NEGATIVE
Neisseria Gonorrhea: NEGATIVE
Trichomonas: NEGATIVE

## 2020-08-30 ENCOUNTER — Other Ambulatory Visit: Payer: Self-pay | Admitting: Family Medicine

## 2021-04-03 ENCOUNTER — Encounter: Payer: Self-pay | Admitting: Emergency Medicine

## 2021-04-03 ENCOUNTER — Other Ambulatory Visit (HOSPITAL_COMMUNITY)
Admission: RE | Admit: 2021-04-03 | Discharge: 2021-04-03 | Disposition: A | Discharge status: Home / Self Care | Payer: 59 | Source: Ambulatory Visit | Attending: Advanced Practice Midwife | Admitting: Advanced Practice Midwife

## 2021-04-03 ENCOUNTER — Ambulatory Visit (INDEPENDENT_AMBULATORY_CARE_PROVIDER_SITE_OTHER): Payer: 59 | Admitting: Advanced Practice Midwife

## 2021-04-03 ENCOUNTER — Encounter: Payer: Self-pay | Admitting: Advanced Practice Midwife

## 2021-04-03 ENCOUNTER — Other Ambulatory Visit: Payer: Self-pay

## 2021-04-03 ENCOUNTER — Ambulatory Visit
Admission: EM | Admit: 2021-04-03 | Discharge: 2021-04-03 | Disposition: A | Discharge status: Home / Self Care | Payer: 59 | Attending: Emergency Medicine | Admitting: Emergency Medicine

## 2021-04-03 VITALS — BP 120/80 | Ht 70.0 in | Wt 252.0 lb

## 2021-04-03 DIAGNOSIS — Z20822 Contact with and (suspected) exposure to covid-19: Secondary | ICD-10-CM

## 2021-04-03 DIAGNOSIS — Z1159 Encounter for screening for other viral diseases: Secondary | ICD-10-CM

## 2021-04-03 DIAGNOSIS — Z113 Encounter for screening for infections with a predominantly sexual mode of transmission: Secondary | ICD-10-CM | POA: Diagnosis not present

## 2021-04-03 DIAGNOSIS — N644 Mastodynia: Secondary | ICD-10-CM

## 2021-04-03 DIAGNOSIS — Z30431 Encounter for routine checking of intrauterine contraceptive device: Secondary | ICD-10-CM | POA: Diagnosis not present

## 2021-04-03 DIAGNOSIS — J029 Acute pharyngitis, unspecified: Secondary | ICD-10-CM

## 2021-04-03 DIAGNOSIS — N6325 Unspecified lump in the left breast, overlapping quadrants: Secondary | ICD-10-CM | POA: Diagnosis not present

## 2021-04-03 LAB — GROUP A STREP BY PCR: Group A Strep by PCR: NOT DETECTED

## 2021-04-03 LAB — SARS CORONAVIRUS 2 (TAT 6-24 HRS): SARS Coronavirus 2: NEGATIVE

## 2021-04-03 MED ORDER — FLUTICASONE PROPIONATE 50 MCG/ACT NA SUSP: 2.0000 | Freq: Every day | NASAL | 0 refills | Status: DC

## 2021-04-03 MED ORDER — IBUPROFEN 600 MG PO TABS: 600.0000 mg | ORAL_TABLET | Freq: Four times a day (QID) | ORAL | 0 refills | Status: DC | PRN

## 2021-04-03 NOTE — ED Triage Notes (Signed)
Patient c/o sore throat and headache that started yesterday.  Patient denies fevers.

## 2021-04-03 NOTE — ED Provider Notes (Addendum)
HPI  SUBJECTIVE:  Patient reports sore throat starting yesterday. Sx worse with swallowing, talking.  Sx better with nothing. has been taking TheraFlu w/ o relief.  No fever   No neck stiffness  + Cough + nasal congestion, rhinorrhea starting today No Myalgias + Frontal headache No Rash  No loss of taste or smell No shortness of breath or difficulty breathing No nausea, vomiting No diarrhea No abdominal pain     No Recent Strep, mono, flu, COVID exposure No reflux sxs No Allergy sxs  No Breathing difficulty, voice changes, sensation of throat swelling shut No Drooling No Trismus No abx in past month.  Got the second dose of the Pfizer vaccine in 9/21. No antipyretic in past 4-6 hrs She has a past medical history of trigeminal neuralgia, frequent strep.  No history of diabetes, hypertension, mono, allergies. LMP: Has an IUD.  Denies possibility of being pregnant PMD: [facility]   Past Medical History:  Diagnosis Date  . Bilateral chronic knee pain   . Genital herpes   . History of Papanicolaou smear of cervix 01/18/12; 05/26/16   -/-; -/- ct/gc neg;  . Obesity (BMI 35.0-39.9 without comorbidity)   . Trigeminal neuralgia pain     Past Surgical History:  Procedure Laterality Date  . WISDOM TOOTH EXTRACTION      Family History  Problem Relation Age of Onset  . Thyroid disease Father   . Hyperlipidemia Father   . Hypertension Father   . Lung cancer Maternal Grandmother 57  . Breast cancer Neg Hx   . Ovarian cancer Neg Hx   . Diabetes Neg Hx     Social History   Tobacco Use  . Smoking status: Never Smoker  . Smokeless tobacco: Never Used  Vaping Use  . Vaping Use: Never used  Substance Use Topics  . Alcohol use: No  . Drug use: No    No current facility-administered medications for this encounter.  Current Outpatient Medications:  .  carbamazepine (TEGRETOL) 200 MG tablet, Take 1 tablet (200 mg total) by mouth 3 (three) times daily., Disp:  90 tablet, Rfl: 0 .  fluticasone (FLONASE) 50 MCG/ACT nasal spray, Place 2 sprays into both nostrils daily., Disp: 16 g, Rfl: 0 .  ibuprofen (ADVIL) 600 MG tablet, Take 1 tablet (600 mg total) by mouth every 6 (six) hours as needed., Disp: 30 tablet, Rfl: 0 .  levonorgestrel (MIRENA) 20 MCG/24HR IUD, 1 each by Intrauterine route once., Disp: , Rfl:  .  valACYclovir (VALTREX) 500 MG tablet, Take one tablet daily, Disp: 90 tablet, Rfl: 1  No Known Allergies   ROS  As noted in HPI.   Physical Exam  BP 118/73 (BP Location: Right Arm)   Pulse 84   Temp 98.4 F (36.9 C) (Oral)   Resp 14   Ht 5\' 10"  (1.778 m)   Wt 111.1 kg   SpO2 98%   BMI 35.15 kg/m   Constitutional: Well developed, well nourished, no acute distress Eyes:  EOMI, conjunctiva normal bilaterally HENT: Normocephalic, atraumatic,mucus membranes moist.  - nasal congestion + intensely beefy red  oropharynx - enlarged tonsils - exudates. Uvula midline.  Positive cobblestoning and postnasal drip Respiratory: Normal inspiratory effort Cardiovascular: Normal rate, no murmurs, rubs, gallops GI: nondistended, nontender. No appreciable splenomegaly skin: No rash, skin intact Lymph: -  Anterior cervical LN.  No posterior cervical lymphadenopathy Musculoskeletal: no deformities Neurologic: Alert & oriented x 3, no focal neuro deficits Psychiatric: Speech and behavior appropriate.  ED Course   Medications - No data to display  Orders Placed This Encounter  Procedures  . Group A Strep by PCR    Standing Status:   Standing    Number of Occurrences:   1  . SARS CORONAVIRUS 2 (TAT 6-24 HRS) Nasopharyngeal Nasopharyngeal Swab    Standing Status:   Standing    Number of Occurrences:   1    Order Specific Question:   Is this test for diagnosis or screening    Answer:   Diagnosis of ill patient    Order Specific Question:   Symptomatic for COVID-19 as defined by CDC    Answer:   Yes    Order Specific Question:   Date of  Symptom Onset    Answer:   04/02/2021    Order Specific Question:   Hospitalized for COVID-19    Answer:   No    Order Specific Question:   Admitted to ICU for COVID-19    Answer:   No    Order Specific Question:   Previously tested for COVID-19    Answer:   No    Order Specific Question:   Resident in a congregate (group) care setting    Answer:   No    Order Specific Question:   Employed in healthcare setting    Answer:   No    Order Specific Question:   Pregnant    Answer:   No    Order Specific Question:   Has patient completed COVID vaccination(s) (2 doses of Pfizer/Moderna 1 dose of [employer])    Answer:   Yes    Order Specific Question:   Has patient completed COVID Booster / 3rd dose    Answer:   No    No results found for this or any previous visit (from the past 24 hour(s)). No results found.  ED Clinical Impression  1. Pharyngitis, unspecified etiology   2. Encounter for laboratory testing for COVID-19 virus      ED Assessment/Plan  Strep PCR, COVID sent.  will contact patient at [phone] only if strep is positive we will call in the appropriate antibiotics at that time.  Strep PCR negative.  Presentation consistent with a viral pharyngitis. Benadryl/Maalox, Tylenol/ibuprofen, Flonase, saline nasal irrigation, Mucinex or Mucinex D.. Patient to followup with PMD when necessary,   Discussed labs,  MDM, plan and followup with patient. Discussed sn/sx that should prompt return to the ED. patient agrees with plan.   04/04/21 1108.  Addendum: Covid negative.  Meds ordered this encounter  Medications  . ibuprofen (ADVIL) 600 MG tablet    Sig: Take 1 tablet (600 mg total) by mouth every 6 (six) hours as needed.    Dispense:  30 tablet    Refill:  0  . fluticasone (FLONASE) 50 MCG/ACT nasal spray    Sig: Place 2 sprays into both nostrils daily.    Dispense:  16 g    Refill:  0     *This clinic note was created using Scientist, clinical (histocompatibility and immunogenetics).  Therefore, there may be occasional mistakes despite careful proofreading.     Domenick Gong, MD 04/03/21 1009    Domenick Gong, MD 04/04/21 1108

## 2021-04-03 NOTE — Discharge Instructions (Addendum)
I will call you only if your strep PCR comes back positive.  If you do not hear from me within several hours, you can assume that it was negative.  If you have MyChart, you will get the results sooner.  Or you can call here and get your results.  I will call in a prescription of antibiotics if your strep is positive.  COVID will be back in 6 to 24 hours 1000 mg of Tylenol and 600 mg ibuprofen together 3-4 times a day as needed for pain.  Make sure you drink plenty of extra fluids.  Some people find salt water gargles and  Traditional Medicinal's "Throat Coat" tea helpful. Take 5 mL of liquid Benadryl and 5 mL of Maalox. Mix it together, and then hold it in your mouth for as long as you can and then swallow. You may do this 4 times a day.    Go to www.goodrx.com to look up your medications. This will give you a list of where you can find your prescriptions at the most affordable prices. Or ask the pharmacist what the cash price is, or if they have any other discount programs available to help make your medication more affordable. This can be less expensive than what you would pay with insurance.

## 2021-04-04 LAB — HEPATITIS PANEL, ACUTE
Hep A IgM: NEGATIVE
Hep B C IgM: NEGATIVE
Hep C Virus Ab: <0.1 s/co ratio (ref 0.0–0.9)
Hepatitis B Surface Ag: NEGATIVE

## 2021-04-04 LAB — HIV ANTIBODY (ROUTINE TESTING W REFLEX): HIV Screen 4th Generation wRfx: NONREACTIVE

## 2021-04-04 LAB — RPR QUALITATIVE: RPR Ser Ql: NONREACTIVE

## 2021-04-06 ENCOUNTER — Encounter: Payer: Self-pay | Admitting: Advanced Practice Midwife

## 2021-04-06 LAB — CERVICOVAGINAL ANCILLARY ONLY
Chlamydia: NEGATIVE
Comment: NEGATIVE
Comment: NEGATIVE
Comment: NORMAL
Neisseria Gonorrhea: NEGATIVE
Trichomonas: NEGATIVE

## 2021-04-06 NOTE — Progress Notes (Signed)
Patient ID: Sarah Bryant, female   DOB: 06/20/1986, 35 y.o.   MRN: [id number]  Reason for Consult: std testing , iud check , and Breast Pain    Subjective:  Date of Service: 04/03/2021  HPI:  Sarah Bryant is a 35 y.o. female is in the office with several concerns. She is feeling her IUD strings longer than usual for the past 4 days and requests a string check. She requests STD testing as a precaution. She denies any specific exposure.   She noticed a sore area in her left inner breast 2 days ago. She denies any trauma or a new bra. There is a small lump in the area of tenderness. She does not have a family history of breast or ovarian cancer.  Past Medical History:  Diagnosis Date  . Bilateral chronic knee pain   . Genital herpes   . History of Papanicolaou smear of cervix 01/18/12; 05/26/16   -/-; -/- ct/gc neg;  . Obesity (BMI 35.0-39.9 without comorbidity)   . Trigeminal neuralgia pain    Family History  Problem Relation Age of Onset  . Thyroid disease Father   . Hyperlipidemia Father   . Hypertension Father   . Lung cancer Maternal Grandmother 71  . Breast cancer Neg Hx   . Ovarian cancer Neg Hx   . Diabetes Neg Hx    Past Surgical History:  Procedure Laterality Date  . WISDOM TOOTH EXTRACTION      Short Social History:  Social History   Tobacco Use  . Smoking status: Never Smoker  . Smokeless tobacco: Never Used  Substance Use Topics  . Alcohol use: No    No Known Allergies  Current Outpatient Medications  Medication Sig Dispense Refill  . fluticasone (FLONASE) 50 MCG/ACT nasal spray Place 2 sprays into both nostrils daily. 16 g 0  . ibuprofen (ADVIL) 600 MG tablet Take 1 tablet (600 mg total) by mouth every 6 (six) hours as needed. 30 tablet 0  . levonorgestrel (MIRENA) 20 MCG/24HR IUD 1 each by Intrauterine route once.    . carbamazepine (TEGRETOL) 200 MG tablet Take 1 tablet (200 mg total) by mouth 3 (three) times daily. 90 tablet 0  .  valACYclovir (VALTREX) 500 MG tablet Take one tablet daily 90 tablet 1   No current facility-administered medications for this visit.    Review of Systems  Constitutional: Negative for chills and fever.  HENT: Negative for congestion, ear discharge, ear pain, hearing loss, sinus pain and sore throat.   Eyes: Negative for blurred vision and double vision.  Respiratory: Negative for cough, shortness of breath and wheezing.   Cardiovascular: Negative for chest pain, palpitations and leg swelling.  Gastrointestinal: Negative for abdominal pain, blood in stool, constipation, diarrhea, heartburn, melena, nausea and vomiting.  Genitourinary: Negative for dysuria, flank pain, frequency, hematuria and urgency.       Positive for feeling longer IUD strings  Musculoskeletal: Negative for back pain, joint pain and myalgias.  Skin: Negative for itching and rash.  Neurological: Negative for dizziness, tingling, tremors, sensory change, speech change, focal weakness, seizures, loss of consciousness, weakness and headaches.  Endo/Heme/Allergies: Negative for environmental allergies. Does not bruise/bleed easily.  Psychiatric/Behavioral: Negative for depression, hallucinations, memory loss, substance abuse and suicidal ideas. The patient is not nervous/anxious and does not have insomnia.   Breast: Positive for sore lump in left breast      Objective:  Objective   Vitals:   04/03/21 1017  BP: 120/80  Weight: 252 lb (114.3 kg)  Height: 5\' 10"  (1.778 m)   Body mass index is 36.16 kg/m. Constitutional: Well nourished, well developed female in no acute distress.  HEENT: normal Breast: Right breast; no masses, skin changes, tenderness. Left Breast; 1 cm x 2 cm ropey feeling mass at 9:00 o'clock, 13 cm from nipple, tender to palpation Skin: Warm and dry.   Extremity: no edema  Respiratory: Clear to auscultation bilateral. Normal respiratory effort Neuro: DTRs 2+, Cranial nerves grossly intact Psych:  Alert and Oriented x3. No memory deficits. Normal mood and affect.  MS: normal gait, normal bilateral lower extremity ROM/strength/stability.  Pelvic exam:  is not limited by body habitus EGBUS: within normal limits Vagina: within normal limits and with normal mucosa Cervix: IUD strings with normal length approximately 3 cm   Assessment/Plan:     35 y.o. G2 P58 female with new onset tender left breast lump, IUD strings in place, STD testing  Diagnostic Mammogram/ultrasound Aptima- STDs STD blood work Follow up as needed   P0 CNM [facility] 04/06/2021, 10:19 AM

## 2021-04-08 ENCOUNTER — Telehealth: Payer: Self-pay | Admitting: Emergency Medicine

## 2021-04-08 ENCOUNTER — Ambulatory Visit
Admission: EM | Admit: 2021-04-08 | Discharge: 2021-04-08 | Disposition: A | Discharge status: Home / Self Care | Payer: 59 | Attending: Emergency Medicine | Admitting: Emergency Medicine

## 2021-04-08 ENCOUNTER — Other Ambulatory Visit: Payer: Self-pay

## 2021-04-08 DIAGNOSIS — J019 Acute sinusitis, unspecified: Secondary | ICD-10-CM | POA: Diagnosis not present

## 2021-04-08 MED ORDER — FLUCONAZOLE 150 MG PO TABS: 150.0000 mg | ORAL_TABLET | Freq: Every day | ORAL | 0 refills | Status: AC

## 2021-04-08 MED ORDER — BENZONATATE 200 MG PO CAPS: 200.0000 mg | ORAL_CAPSULE | Freq: Three times a day (TID) | ORAL | 0 refills | Status: DC | PRN

## 2021-04-08 MED ORDER — HYDROCOD POLST-CPM POLST ER 10-8 MG/5ML PO SUER: 5.0000 mL | Freq: Two times a day (BID) | ORAL | 0 refills | Status: DC | PRN

## 2021-04-08 MED ORDER — DOXYCYCLINE HYCLATE 100 MG PO CAPS: 100.0000 mg | ORAL_CAPSULE | Freq: Two times a day (BID) | ORAL | 0 refills | Status: AC

## 2021-04-08 NOTE — ED Triage Notes (Signed)
Patient states that she has been having sinus pain and pressure, headaches and cough that started on Thursday. States that she was seen here on Friday by Dr. Chaney Malling and would like to see her again.

## 2021-04-08 NOTE — Discharge Instructions (Addendum)
Finish doxycycline, even if you feel better.  Saline nasal irrigation with a Lloyd Huger Med rinse and distilled water as often as you want for the nasal congestion and postnasal drip.  Tessalon for the cough, continue Tylenol combined with ibuprofen 3-4 times a day as needed for pain.  I would stop the Benadryl and take Mucinex D instead.  Tessalon for the cough during the day, Tussionex for the cough at night.

## 2021-04-08 NOTE — ED Provider Notes (Signed)
HPI  SUBJECTIVE:  Sarah Bryant is a 35 y.o. female who presents with 6 days of nasal congestion, sinus pain and pressure, postnasal drip, thick yellow rhinorrhea.  Cough starting 4 days ago with posttussive emesis.  She reports chest soreness from coughing and states that she is unable to sleep at night secondary to the cough.  She initially had a sore throat which resolved, and came back.  She thinks that the sore throat is due to the cough this time.  No fevers, upper dental pain, facial swelling, wheezing, shortness of breath.  No antipyretic in the past 6 hours.   She has been doing Flonase, Benadryl, Tylenol/ibuprofen and Alka-Seltzer plus without improvement in her symptoms.  No aggravating factors.  I saw the patient here 5 days ago for sore throat, nasal congestion, rhinorrhea and frontal headache.  She was noted to have intensely erythematous oropharynx, however no lymphadenopathy.  COVID, strep PCR was negative. Patient states that she is getting worse.  Past medical history negative for pulmonary disease, smoking, diabetes, frequent sinusitis.  LMP: Has an IUD.  Denies the possibility being pregnant.  PMD: [facility]  Past Medical History:  Diagnosis Date  . Bilateral chronic knee pain   . Genital herpes   . History of Papanicolaou smear of cervix 01/18/12; 05/26/16   -/-; -/- ct/gc neg;  . Obesity (BMI 35.0-39.9 without comorbidity)   . Trigeminal neuralgia pain     Past Surgical History:  Procedure Laterality Date  . WISDOM TOOTH EXTRACTION      Family History  Problem Relation Age of Onset  . Thyroid disease Father   . Hyperlipidemia Father   . Hypertension Father   . Lung cancer Maternal Grandmother 80  . Breast cancer Neg Hx   . Ovarian cancer Neg Hx   . Diabetes Neg Hx     Social History   Tobacco Use  . Smoking status: Never Smoker  . Smokeless tobacco: Never Used  Vaping Use  . Vaping Use: Never used  Substance Use Topics  . Alcohol use: No  .  Drug use: No    No current facility-administered medications for this encounter.  Current Outpatient Medications:  .  benzonatate (TESSALON) 200 MG capsule, Take 1 capsule (200 mg total) by mouth 3 (three) times daily as needed for cough., Disp: 30 capsule, Rfl: 0 .  carbamazepine (TEGRETOL) 200 MG tablet, Take 1 tablet (200 mg total) by mouth 3 (three) times daily., Disp: 90 tablet, Rfl: 0 .  chlorpheniramine-HYDROcodone (TUSSIONEX PENNKINETIC ER) 10-8 MG/5ML SUER, Take 5 mLs by mouth every 12 (twelve) hours as needed for cough., Disp: 60 mL, Rfl: 0 .  doxycycline (VIBRAMYCIN) 100 MG capsule, Take 1 capsule (100 mg total) by mouth 2 (two) times daily for 10 days., Disp: 20 capsule, Rfl: 0 .  fluticasone (FLONASE) 50 MCG/ACT nasal spray, Place 2 sprays into both nostrils daily., Disp: 16 g, Rfl: 0 .  ibuprofen (ADVIL) 600 MG tablet, Take 1 tablet (600 mg total) by mouth every 6 (six) hours as needed., Disp: 30 tablet, Rfl: 0 .  levonorgestrel (MIRENA) 20 MCG/24HR IUD, 1 each by Intrauterine route once., Disp: , Rfl:  .  topiramate (TOPAMAX) 50 MG tablet, Take 50 mg by mouth at bedtime., Disp: , Rfl:   No Known Allergies   ROS  As noted in HPI.   Physical Exam  BP 111/73 (BP Location: Left Arm)   Pulse 89   Temp 98.5 F (36.9 C) (Oral)  Resp 18   Ht 5\' 10"  (1.778 m)   Wt 114.3 kg   SpO2 99%   BMI 36.16 kg/m   Constitutional: Well developed, well nourished, no acute distress.  Coughing. Eyes:  EOMI, conjunctiva normal bilaterally HENT: Normocephalic, atraumatic,mucus membranes moist.  Positive nasal congestion.  Positive erythematous turbinates.  Mild swelling.  No maxillary, frontal sinus tenderness.  Continued erythematous oropharynx, tonsils normal without exudates.  Uvula midline.  Extensive postnasal drip. Neck: No cervical lymphadenopathy Respiratory: Normal inspiratory effort, lungs clear bilaterally.  Good air movement.  Positive anterior and lateral chest wall  tenderness Cardiovascular: Normal rate, regular rhythm, no murmurs rubs or gallops. GI: nondistended skin: No rash, skin intact Musculoskeletal: no deformities Neurologic: Alert & oriented x 3, no focal neuro deficits Psychiatric: Speech and behavior appropriate   ED Course   Medications - No data to display  No orders of the defined types were placed in this encounter.   No results found for this or any previous visit (from the past 24 hour(s)). No results found.  ED Clinical Impression  1. Acute non-recurrent sinusitis, unspecified location      ED Assessment/Plan  [place] Narcotic database reviewed for this patient, and feel that the risk/benefit ratio today is favorable for proceeding with a prescription for controlled substance.  No opiate prescriptions in 2 years.  Patient is getting worse.  Suspect that she has developed a secondary sinusitis from the initial URI.   home with doxycycline, which will also cover pneumonia although pneumonia is low my differential today, saline nasal irrigation, Tessalon, continue Tylenol/ibuprofen.  Stop antihistamine.  Start Mucinex.  States that she is unable to sleep at night secondary to the cough, so will send home with Tussionex.  Follow-up with [facility] ASAP for routine care.  Also may return here.  Discussed labs, MDM, treatment plan, and plan for follow-up with patient. . patient agrees with plan.   Meds ordered this encounter  Medications  . doxycycline (VIBRAMYCIN) 100 MG capsule    Sig: Take 1 capsule (100 mg total) by mouth 2 (two) times daily for 10 days.    Dispense:  20 capsule    Refill:  0  . benzonatate (TESSALON) 200 MG capsule    Sig: Take 1 capsule (200 mg total) by mouth 3 (three) times daily as needed for cough.    Dispense:  30 capsule    Refill:  0  . chlorpheniramine-HYDROcodone (TUSSIONEX PENNKINETIC ER) 10-8 MG/5ML SUER    Sig: Take 5 mLs by mouth every 12 (twelve) hours as needed for cough.     Dispense:  60 mL    Refill:  0      *This clinic note was created using . Therefore, there may be occasional mistakes despite careful proofreading.  ?    Scientist, clinical (histocompatibility and immunogenetics), MD 04/08/21 1131

## 2021-04-13 ENCOUNTER — Emergency Department: Payer: 59

## 2021-04-13 ENCOUNTER — Other Ambulatory Visit: Payer: 59

## 2021-04-13 ENCOUNTER — Emergency Department
Admission: EM | Admit: 2021-04-13 | Discharge: 2021-04-13 | Disposition: A | Discharge status: Home / Self Care | Payer: 59 | Attending: Emergency Medicine | Admitting: Emergency Medicine

## 2021-04-13 DIAGNOSIS — R059 Cough, unspecified: Secondary | ICD-10-CM | POA: Diagnosis present

## 2021-04-13 DIAGNOSIS — Z20822 Contact with and (suspected) exposure to covid-19: Secondary | ICD-10-CM | POA: Diagnosis not present

## 2021-04-13 DIAGNOSIS — B349 Viral infection, unspecified: Secondary | ICD-10-CM

## 2021-04-13 LAB — CBC WITH DIFFERENTIAL/PLATELET
Abs Immature Granulocytes: 0.04 10*3/uL (ref 0.00–0.07)
Basophils Absolute: 0.1 10*3/uL (ref 0.0–0.1)
Basophils Relative: 1 %
Eosinophils Absolute: 0.2 10*3/uL (ref 0.0–0.5)
Eosinophils Relative: 2 %
HCT: 37.5 % (ref 36.0–46.0)
Hemoglobin: 12.4 g/dL (ref 12.0–15.0)
Immature Granulocytes: 0 %
Lymphocytes Relative: 30 %
Lymphs Abs: 3.2 10*3/uL (ref 0.7–4.0)
MCH: 27.9 pg (ref 26.0–34.0)
MCHC: 33.1 g/dL (ref 30.0–36.0)
MCV: 84.5 fL (ref 80.0–100.0)
Monocytes Absolute: 0.7 10*3/uL (ref 0.1–1.0)
Monocytes Relative: 7 %
Neutro Abs: 6.5 10*3/uL (ref 1.7–7.7)
Neutrophils Relative %: 60 %
Platelets: 314 10*3/uL (ref 150–400)
RBC: 4.44 MIL/uL (ref 3.87–5.11)
RDW: 12.4 % (ref 11.5–15.5)
WBC: 10.7 10*3/uL — ABNORMAL HIGH (ref 4.0–10.5)
nRBC: 0 % (ref 0.0–0.2)

## 2021-04-13 LAB — COMPREHENSIVE METABOLIC PANEL
ALT: 16 U/L (ref 0–44)
AST: 15 U/L (ref 15–41)
Albumin: 3.3 g/dL — ABNORMAL LOW (ref 3.5–5.0)
Alkaline Phosphatase: 59 U/L (ref 38–126)
Anion gap: 9 (ref 5–15)
BUN: 9 mg/dL (ref 6–20)
CO2: 25 mmol/L (ref 22–32)
Calcium: 8.6 mg/dL — ABNORMAL LOW (ref 8.9–10.3)
Chloride: 103 mmol/L (ref 98–111)
Creatinine, Ser: 0.81 mg/dL (ref 0.44–1.00)
GFR, Estimated: >60 mL/min (ref >60)
Glucose, Bld: 120 mg/dL — ABNORMAL HIGH (ref 70–99)
Potassium: 3 mmol/L — ABNORMAL LOW (ref 3.5–5.1)
Sodium: 137 mmol/L (ref 135–145)
Total Bilirubin: 0.6 mg/dL (ref 0.3–1.2)
Total Protein: 7.7 g/dL (ref 6.5–8.1)

## 2021-04-13 LAB — URINALYSIS, COMPLETE (UACMP) WITH MICROSCOPIC
Bacteria, UA: NONE SEEN
Bilirubin Urine: NEGATIVE
Glucose, UA: NEGATIVE mg/dL
Ketones, ur: NEGATIVE mg/dL
Leukocytes,Ua: NEGATIVE
Nitrite: NEGATIVE
Protein, ur: NEGATIVE mg/dL
Specific Gravity, Urine: 1.02 (ref 1.005–1.030)
pH: 6 (ref 5.0–8.0)

## 2021-04-13 LAB — LACTIC ACID, PLASMA: Lactic Acid, Venous: 1 mmol/L (ref 0.5–1.9)

## 2021-04-13 LAB — RESP PANEL BY RT-PCR (FLU A&B, COVID) ARPGX2
Influenza A by PCR: NEGATIVE
Influenza B by PCR: NEGATIVE
SARS Coronavirus 2 by RT PCR: NEGATIVE

## 2021-04-13 MED ORDER — COMPRESSOR/NEBULIZER MISC: 1.0000 [IU] | 0 refills | Status: DC | PRN

## 2021-04-13 MED ORDER — ACETAMINOPHEN 500 MG PO TABS
1000.0000 mg | ORAL_TABLET | Freq: Once | ORAL | Status: AC
  Administered 2021-04-13: 1000 mg via ORAL
  Filled 2021-04-13: qty 2

## 2021-04-13 MED ORDER — ALBUTEROL SULFATE (2.5 MG/3ML) 0.083% IN NEBU: 2.5000 mg | INHALATION_SOLUTION | RESPIRATORY_TRACT | 0 refills | Status: DC | PRN

## 2021-04-13 NOTE — ED Provider Notes (Signed)
[facility] Emergency Department Provider Note   ____________________________________________   Event Date/Time   First MD Initiated Contact with Patient 04/13/21 [phone]     (approximate)  I have reviewed the triage vital signs and the nursing notes.   HISTORY  Chief Complaint Cough, Generalized Body Aches, and Fever    HPI Sarah Bryant is a 35 y.o. female who presents to the ED from home with a chief complaint of cough, body aches and fever x10 days.  Has been seen at urgent care twice and most recently prescribed doxycycline for sinus infection.  Patient has not taken the antibiotic because she is on Tegretol and is concerned for interactions.  She is taking Tussionex and Benzonatate for cough.  Denies chest pain, shortness of breath, abdominal pain, dysuria or diarrhea.  States she gets into coughing spasms and has posttussive emesis     Past Medical History:  Diagnosis Date  . Bilateral chronic knee pain   . Genital herpes   . History of Papanicolaou smear of cervix 01/18/12; 05/26/16   -/-; -/- ct/gc neg;  . Obesity (BMI 35.0-39.9 without comorbidity)   . Trigeminal neuralgia pain     Patient Active Problem List   Diagnosis Date Noted  . Obesity (BMI 35.0-39.9 without comorbidity)   . Genital herpes   . Bilateral chronic knee pain   . Trigeminal neuralgia pain     Past Surgical History:  Procedure Laterality Date  . WISDOM TOOTH EXTRACTION      Prior to Admission medications   Medication Sig Start Date End Date Taking? Authorizing Provider  albuterol (PROVENTIL) (2.5 MG/3ML) 0.083% nebulizer solution Take 3 mLs (2.5 mg total) by nebulization every 4 (four) hours as needed for wheezing or shortness of breath. 04/13/21  Yes Irean Hong, MD  Nebulizers (COMPRESSOR/NEBULIZER) MISC 1 Units by Does not apply route every 4 (four) hours as needed. 04/13/21  Yes Irean Hong, MD  benzonatate (TESSALON) 200 MG capsule Take 1 capsule (200 mg  total) by mouth 3 (three) times daily as needed for cough. 04/08/21   Domenick Gong, MD  carbamazepine (TEGRETOL) 200 MG tablet Take 1 tablet (200 mg total) by mouth 3 (three) times daily. 08/07/20 09/06/20  Tommie Sams, DO  chlorpheniramine-HYDROcodone (TUSSIONEX PENNKINETIC ER) 10-8 MG/5ML SUER Take 5 mLs by mouth every 12 (twelve) hours as needed for cough. 04/08/21   Domenick Gong, MD  doxycycline (VIBRAMYCIN) 100 MG capsule Take 1 capsule (100 mg total) by mouth 2 (two) times daily for 10 days. 04/08/21 04/18/21  Domenick Gong, MD  fluticasone (FLONASE) 50 MCG/ACT nasal spray Place 2 sprays into both nostrils daily. 04/03/21   Domenick Gong, MD  ibuprofen (ADVIL) 600 MG tablet Take 1 tablet (600 mg total) by mouth every 6 (six) hours as needed. 04/03/21   Domenick Gong, MD  levonorgestrel (MIRENA) 20 MCG/24HR IUD 1 each by Intrauterine route once.    [provider]  topiramate (TOPAMAX) 50 MG tablet Take 50 mg by mouth at bedtime. 10/21/20   [provider]  SUMAtriptan (IMITREX) 50 MG tablet Take 1 tablet (50 mg total) by mouth once for 1 dose. May repeat in 2 hours if headache persists or recurs. 02/06/18 07/16/20  Tommie Sams, DO    Allergies Patient has no known allergies.  Family History  Problem Relation Age of Onset  . Thyroid disease Father   . Hyperlipidemia Father   . Hypertension Father   . Lung cancer Maternal  Grandmother 65  . Breast cancer Neg Hx   . Ovarian cancer Neg Hx   . Diabetes Neg Hx     Social History Social History   Tobacco Use  . Smoking status: Never Smoker  . Smokeless tobacco: Never Used  Vaping Use  . Vaping Use: Never used  Substance Use Topics  . Alcohol use: No  . Drug use: No    Review of Systems  Constitutional: Positive for fever and body aches. Eyes: No visual changes. ENT: No sore throat. Cardiovascular: Denies chest pain. Respiratory: Positive for cough. Denies shortness of  breath. Gastrointestinal: No abdominal pain.  Positive for posttussive emesis. No nausea, no vomiting.  No diarrhea.  No constipation. Genitourinary: Negative for dysuria. Musculoskeletal: Negative for back pain. Skin: Negative for rash. Neurological: Negative for headaches, focal weakness or numbness.   ____________________________________________   PHYSICAL EXAM:  VITAL SIGNS: ED Triage Vitals  Enc Vitals Group     BP 04/13/21 0109 (!) 141/92     Pulse Rate 04/13/21 0109 (!) 124     Resp 04/13/21 0109 (!) 24     Temp 04/13/21 0109 (!) 100.6 F (38.1 C)     Temp Source 04/13/21 0109 Oral     SpO2 04/13/21 0109 97 %     Weight 04/13/21 0109 253 lb 11.2 oz (115.1 kg)     Height 04/13/21 0109 5\' 10"  (1.778 m)     Head Circumference --      Peak Flow --      Pain Score 04/13/21 0107 5     Pain Loc --      Pain Edu? --      Excl. in GC? --     Constitutional: Alert and oriented. Well appearing and in no acute distress. Eyes: Conjunctivae are normal. PERRL. EOMI. Head: Atraumatic. Ears: Bilateral TM dullness. Nose: Congestion/rhinnorhea. Mouth/Throat: Mucous membranes are moist.  Oropharynx non-erythematous. Neck: No stridor.   Cardiovascular: Normal rate, regular rhythm. Grossly normal heart sounds.  Good peripheral circulation. Respiratory: Normal respiratory effort.  No retractions. Lungs CTAB.  Dry cough noted. Gastrointestinal: Soft and nontender. No distention. No abdominal bruits. No CVA tenderness. Musculoskeletal: No lower extremity tenderness nor edema.  No joint effusions. Neurologic:  Normal speech and language. No gross focal neurologic deficits are appreciated. No gait instability. Skin:  Skin is warm, dry and intact. No rash noted.  No petechiae. Psychiatric: Mood and affect are normal. Speech and behavior are normal.  ____________________________________________   LABS (all labs ordered are listed, but only abnormal results are displayed)  Labs Reviewed   COMPREHENSIVE METABOLIC PANEL - Abnormal; Notable for the following components:      Result Value   Potassium 3.0 (*)    Glucose, Bld 120 (*)    Calcium 8.6 (*)    Albumin 3.3 (*)    All other components within normal limits  CBC WITH DIFFERENTIAL/PLATELET - Abnormal; Notable for the following components:   WBC 10.7 (*)    All other components within normal limits  RESP PANEL BY RT-PCR (FLU A&B, COVID) ARPGX2  CULTURE, BLOOD (ROUTINE X 2)  CULTURE, BLOOD (ROUTINE X 2)  LACTIC ACID, PLASMA  URINALYSIS, COMPLETE (UACMP) WITH MICROSCOPIC  POC URINE PREG, ED   ____________________________________________  EKG  None ____________________________________________  RADIOLOGY I, Landin Tallon J, personally viewed and evaluated these images (plain radiographs) as part of my medical decision making, as well as reviewing the written report by the radiologist.  ED MD interpretation: No acute cardiopulmonary  process  Official radiology report(s): DG Chest 2 View  Result Date: 04/13/2021 CLINICAL DATA:  Suspected sepsis.  Body aches, fever EXAM: CHEST - 2 VIEW COMPARISON:  None. FINDINGS: Heart and mediastinal contours are within normal limits. No focal opacities or effusions. No acute bony abnormality. IMPRESSION: No active cardiopulmonary disease. Electronically Signed   By: Charlett Nose M.D.   On: 04/13/2021 01:43    ____________________________________________   PROCEDURES  Procedure(s) performed (including Critical Care):  Procedures   ____________________________________________   INITIAL IMPRESSION / ASSESSMENT AND PLAN / ED COURSE  As part of my medical decision making, I reviewed the following data within the electronic MEDICAL RECORD NUMBER Nursing notes reviewed and incorporated, Labs reviewed, Old chart reviewed, Radiograph reviewed and Notes from prior ED visits     35 year old female with a 10-day history of fever, body aches and cough. Differential includes, but is not  limited to, viral syndrome, bronchitis including COPD exacerbation, pneumonia, reactive airway disease including asthma, CHF including exacerbation with or without pulmonary/interstitial edema, pneumothorax, ACS, thoracic trauma, and pulmonary embolism.  Sepsis work-up negative, no pneumonia on chest x-ray, respiratory panel was negative.  Suspect viral illness.  Agree with patient not starting doxycycline.  Will add Albuterol nebulizer to use as needed for cough.  Strict return precautions given.  Patient verbalizes understanding agrees to plan of care.      ____________________________________________   FINAL CLINICAL IMPRESSION(S) / ED DIAGNOSES  Final diagnoses:  Viral illness  Cough     ED Discharge Orders         Ordered    albuterol (PROVENTIL) (2.5 MG/3ML) 0.083% nebulizer solution  Every 4 hours PRN        04/13/21 0332    Nebulizers (COMPRESSOR/NEBULIZER) MISC  Every 4 hours PRN        04/13/21 0332          *Please note:  Dierra Corinne Keasling was evaluated in Emergency Department on 04/13/2021 for the symptoms described in the history of present illness. She was evaluated in the context of the global COVID-19 pandemic, which necessitated consideration that the patient might be at risk for infection with the SARS-CoV-2 virus that causes COVID-19. Institutional protocols and algorithms that pertain to the evaluation of patients at risk for COVID-19 are in a state of rapid change based on information released by regulatory bodies including the CDC and federal and state organizations. These policies and algorithms were followed during the patient's care in the ED.  Some ED evaluations and interventions may be delayed as a result of limited staffing during and the pandemic.*   Note:  This document was prepared using Dragon voice recognition software and may include unintentional dictation errors.   Irean Hong, MD 04/13/21 0430

## 2021-04-13 NOTE — ED Notes (Addendum)
First set of cultures, resp swab, lactic, and rainbow sent to lab

## 2021-04-13 NOTE — Discharge Instructions (Signed)
You may use Albuterol nebulizer every 4 hours as needed for coughing spasms/difficulty breathing.  Return to the ER for worsening symptoms, persistent vomiting, difficulty breathing or other concerns.

## 2021-04-13 NOTE — ED Triage Notes (Signed)
Pt reports cough, body aches, fever x2 weeks, states has been to urgent care twice and was prescribed antibiotics for sinus infection - pt states she did not take the antibiotic because she is on tegretol and states the pharmacist said they could not be taken together. Pt reports she is coughing until she vomits.

## 2021-04-18 LAB — CULTURE, BLOOD (ROUTINE X 2): Culture: NO GROWTH

## 2021-06-17 ENCOUNTER — Other Ambulatory Visit: Payer: Self-pay

## 2021-06-17 ENCOUNTER — Ambulatory Visit
Admission: EM | Admit: 2021-06-17 | Discharge: 2021-06-17 | Disposition: A | Discharge status: Home / Self Care | Payer: PRIVATE HEALTH INSURANCE | Attending: Sports Medicine | Admitting: Sports Medicine

## 2021-06-17 DIAGNOSIS — S40012A Contusion of left shoulder, initial encounter: Secondary | ICD-10-CM

## 2021-06-17 DIAGNOSIS — M25512 Pain in left shoulder: Secondary | ICD-10-CM | POA: Diagnosis not present
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
## 2021-08-17 ENCOUNTER — Ambulatory Visit (INDEPENDENT_AMBULATORY_CARE_PROVIDER_SITE_OTHER): Payer: 59 | Admitting: Obstetrics and Gynecology

## 2021-08-17 ENCOUNTER — Other Ambulatory Visit: Payer: Self-pay

## 2021-08-17 ENCOUNTER — Other Ambulatory Visit (HOSPITAL_COMMUNITY)
Admission: RE | Admit: 2021-08-17 | Discharge: 2021-08-17 | Disposition: A | Discharge status: Home / Self Care | Payer: 59 | Source: Ambulatory Visit | Attending: Obstetrics and Gynecology | Admitting: Obstetrics and Gynecology

## 2021-08-17 ENCOUNTER — Encounter: Payer: Self-pay | Admitting: Obstetrics and Gynecology

## 2021-08-17 VITALS — BP 100/70 | Ht 70.0 in | Wt 260.0 lb

## 2021-08-17 DIAGNOSIS — Z113 Encounter for screening for infections with a predominantly sexual mode of transmission: Secondary | ICD-10-CM

## 2021-08-17 DIAGNOSIS — A6004 Herpesviral vulvovaginitis: Secondary | ICD-10-CM | POA: Diagnosis not present

## 2021-08-17 DIAGNOSIS — Z01419 Encounter for gynecological examination (general) (routine) without abnormal findings: Secondary | ICD-10-CM

## 2021-08-17 DIAGNOSIS — E785 Hyperlipidemia, unspecified: Secondary | ICD-10-CM

## 2021-08-17 DIAGNOSIS — Z131 Encounter for screening for diabetes mellitus: Secondary | ICD-10-CM

## 2021-08-17 DIAGNOSIS — R7303 Prediabetes: Secondary | ICD-10-CM

## 2021-08-17 DIAGNOSIS — Z30431 Encounter for routine checking of intrauterine contraceptive device: Secondary | ICD-10-CM

## 2021-08-17 DIAGNOSIS — Z13220 Encounter for screening for lipoid disorders: Secondary | ICD-10-CM

## 2021-08-17 DIAGNOSIS — Z0000 Encounter for general adult medical examination without abnormal findings: Secondary | ICD-10-CM

## 2021-08-17 MED ORDER — VALACYCLOVIR HCL 500 MG PO TABS: 500.0000 mg | ORAL_TABLET | Freq: Two times a day (BID) | ORAL | 1 refills | Status: AC

## 2021-08-17 NOTE — Progress Notes (Signed)
PCP:  Patient, No Pcp Per (Inactive)   Chief Complaint  Patient presents with   Gynecologic Exam    Std testing     HPI:      Ms. Sarah Bryant is a 35 y.o. G2P1011 whose LMP was No LMP recorded. (Menstrual status: IUD)., presents today for her annual examination.  Her menses are absent with IUD. Occas light spotting, no dysmen.   Sex activity: single partner, contraception - IUD. Mirena replaced 03/30/18. No pain/bleeding. Last Pap: 08/13/20 Results were: no abnormalities /neg HPV DNA 2020 Hx of STDs: chlamydia a few yrs ago; neg STD testing 4/22; HSV 2, takes valtrex prn; needs Rx RF. Would like full STD testing again today (last done 4/22).  Had painful LT breast mass 4/22 and saw [facility]. Sx resolved, never did breast imaging.  There is no FH of breast cancer. There is no FH of ovarian cancer. The patient does not do self-breast exams.  Tobacco use: The patient denies current or previous tobacco use. Alcohol use: none No drug use.  Exercise: not active  She does get adequate calcium and Vitamin D in her diet. Hx of pre-DM and elevated lipids on 8/21 labs, although pt said she wasn't fasting. Will repeat this yr when pt is fasting.   Past Medical History:  Diagnosis Date   Bilateral chronic knee pain    Genital herpes    History of Papanicolaou smear of cervix 01/18/12; 05/26/16   -/-; -/- ct/gc neg;   Obesity (BMI 35.0-39.9 without comorbidity)    Trigeminal neuralgia pain     Past Surgical History:  Procedure Laterality Date   WISDOM TOOTH EXTRACTION      Family History  Problem Relation Age of Onset   Thyroid disease Father    Hyperlipidemia Father    Hypertension Father    Lung cancer Maternal Grandmother 76   Breast cancer Neg Hx    Ovarian cancer Neg Hx    Diabetes Neg Hx     Social History   Socioeconomic History   Marital status: Single    Spouse name: Not on file   Number of children: 1   Years of education: 12   Highest education  level: Not on file  Occupational History   Occupation: Social research officer, government    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    Comment: Mirena  Other Topics Concern   Not on file  Social History Narrative   Not on file   Social Determinants of Health   Financial Resource Strain: Not on file  Food Insecurity: Not on file  Transportation Needs: Not on file  Physical Activity: Not on file  Stress: Not on file  Social Connections: Not on file  Intimate Partner Violence: Not on file     Current Outpatient Medications:    levonorgestrel (MIRENA) 20 MCG/24HR IUD, 1 each by Intrauterine route once., Disp: , Rfl:    valACYclovir (VALTREX) 500 MG tablet, Take 1 tablet (500 mg total) by mouth 2 (two) times daily for 3 days. Prn sx, Disp: 30 tablet, Rfl: 1   carbamazepine (TEGRETOL) 200 MG tablet, Take 1 tablet (200 mg total) by mouth 3 (three) times daily., Disp:  90 tablet, Rfl: 0     ROS:  Review of Systems  Constitutional:  Negative for fatigue, fever and unexpected weight change.  Respiratory:  Negative for cough, shortness of breath and wheezing.   Cardiovascular:  Negative for chest pain, palpitations and leg swelling.  Gastrointestinal:  Negative for blood in stool, constipation, diarrhea, nausea and vomiting.  Endocrine: Negative for cold intolerance, heat intolerance and polyuria.  Genitourinary:  Negative for dyspareunia, dysuria, flank pain, frequency, genital sores, hematuria, menstrual problem, pelvic pain, urgency, vaginal bleeding, vaginal discharge and vaginal pain.  Musculoskeletal:  Negative for back pain, joint swelling and myalgias.  Skin:  Negative for rash.  Neurological:  Negative for dizziness, syncope, light-headedness, numbness  and headaches.  Hematological:  Negative for adenopathy.  Psychiatric/Behavioral:  Negative for agitation, confusion, sleep disturbance and suicidal ideas. The patient is not nervous/anxious.   BREAST: No symptoms   Objective: BP 100/70   Ht 5\' 10"  (1.778 m)   Wt 260 lb (117.9 kg)   BMI 37.31 kg/m    Physical Exam Constitutional:      Appearance: She is well-developed.  Genitourinary:     Vulva normal.     Right Labia: No rash, tenderness or lesions.    Left Labia: No tenderness, lesions or rash.    No vaginal discharge, erythema or tenderness.      Right Adnexa: not tender and no mass present.    Left Adnexa: not tender and no mass present.    No cervical friability or polyp.     IUD strings visualized.     Uterus is not enlarged or tender.  Breasts:    Right: No mass, nipple discharge, skin change or tenderness.     Left: No mass, nipple discharge, skin change or tenderness.  Neck:     Thyroid: No thyromegaly.  Cardiovascular:     Rate and Rhythm: Normal rate and regular rhythm.     Heart sounds: Normal heart sounds. No murmur heard. Pulmonary:     Effort: Pulmonary effort is normal.     Breath sounds: Normal breath sounds.  Abdominal:     Palpations: Abdomen is soft.     Tenderness: There is no abdominal tenderness. There is no guarding or rebound.  Musculoskeletal:        General: Normal range of motion.     Cervical back: Normal range of motion.  Lymphadenopathy:     Cervical: No cervical adenopathy.  Neurological:     General: No focal deficit present.     Mental Status: She is alert and oriented to person, place, and time.     Cranial Nerves: No cranial nerve deficit.  Skin:    General: Skin is warm and dry.  Psychiatric:        Mood and Affect: Mood normal.        Behavior: Behavior normal.        Thought Content: Thought content normal.        Judgment: Judgment normal.  Vitals reviewed.    Assessment/Plan: Encounter for annual routine  gynecological examination  Encounter for routine checking of intrauterine contraceptive device (IUD)--IUD strings in cx os; good till 03/2025  Herpes simplex vulvovaginitis - Plan: valACYclovir (VALTREX) 500 MG tablet; Rx RF  Screening for STD (sexually transmitted disease) - Plan: Cervicovaginal ancillary only, HIV Antibody (routine testing w rflx), RPR, Hepatitis C antibody  Blood tests for routine general physical examination - Plan: Comprehensive metabolic panel, Lipid panel, Hemoglobin A1c  Screening cholesterol level -  Plan: Lipid panel  Elevated lipids - Plan: Lipid panel  Screening for diabetes mellitus - Plan: Hemoglobin A1c  Pre-diabetes - Plan: Hemoglobin A1c  Meds ordered this encounter  Medications   valACYclovir (VALTREX) 500 MG tablet    Sig: Take 1 tablet (500 mg total) by mouth 2 (two) times daily for 3 days. Prn sx    Dispense:  30 tablet    Refill:  1    Order Specific Question:   Supervising Provider    Answer:   Nadara Mustard [[id number]]             GYN counsel adequate intake of calcium and vitamin D, diet and exercise     F/U  Return in about 1 year (around 08/17/2022).  Sarah Chahal B. Garnie Borchardt, PA-C 08/17/2021 4:48 PM

## 2021-08-17 NOTE — Patient Instructions (Signed)
I value your feedback and you entrusting us with your care. If you get a [facility] patient survey, I would appreciate you taking the time to let us know about your experience today. Thank you! ? ? ?

## 2021-08-18 LAB — HIV ANTIBODY (ROUTINE TESTING W REFLEX): HIV Screen 4th Generation wRfx: NONREACTIVE

## 2021-08-18 LAB — HEPATITIS C ANTIBODY: Hep C Virus Ab: <0.1 s/co ratio (ref 0.0–0.9)

## 2021-08-18 LAB — RPR: RPR Ser Ql: NONREACTIVE

## 2021-08-19 LAB — CERVICOVAGINAL ANCILLARY ONLY
Chlamydia: NEGATIVE
Comment: NEGATIVE
Comment: NEGATIVE
Comment: NORMAL
Neisseria Gonorrhea: NEGATIVE
Trichomonas: NEGATIVE

## 2021-10-09 ENCOUNTER — Ambulatory Visit
Admission: EM | Admit: 2021-10-09 | Discharge: 2021-10-09 | Disposition: A | Discharge status: Home / Self Care | Payer: 59 | Attending: Physician Assistant | Admitting: Physician Assistant

## 2021-10-09 ENCOUNTER — Other Ambulatory Visit: Payer: Self-pay

## 2021-10-09 DIAGNOSIS — K625 Hemorrhage of anus and rectum: Secondary | ICD-10-CM | POA: Diagnosis not present

## 2021-10-09 DIAGNOSIS — K648 Other hemorrhoids: Secondary | ICD-10-CM | POA: Diagnosis not present

## 2021-10-09 MED ORDER — HYDROCORTISONE ACETATE 25 MG RE SUPP: 25.0000 mg | Freq: Two times a day (BID) | RECTAL | 0 refills | Status: DC

## 2021-10-09 NOTE — ED Provider Notes (Signed)
[facility]    CSN: [id number] Arrival date & time: 10/09/21  1503      History   Chief Complaint Chief Complaint  Patient presents with   Blood In Stools    HPI Sarah Bryant is a 35 y.o. female presenting for 4 to 5-day history of bright red blood on the toilet paper and in the toilet bowl when she wipes.  Admits to having this only with bowel movements.  She has had fully formed and normal-appearing stools.  Does not believe she has had any black tarry stools.  Patient has history of hemorrhoids with says they normally hurt and have not caused any pain.  She denies any fever, appetite changes, nausea/vomiting/diarrhea, abdominal pain.  Patient says she feels well overall but became concerned with having large clots and clumps of blood come out when she has BMs.  Patient has no history of any significant GI problems.  No injury to anus. No anal intercourse. Denies any history of ulcerative colitis, diverticulosis.  Medical history significant for prediabetes and elevated cholesterol.  No other complaints.  HPI  Past Medical History:  Diagnosis Date   Bilateral chronic knee pain    Genital herpes    History of Papanicolaou smear of cervix 01/18/12; 05/26/16   -/-; -/- ct/gc neg;   Obesity (BMI 35.0-39.9 without comorbidity)    Trigeminal neuralgia pain     Patient Active Problem List   Diagnosis Date Noted   Herpes simplex vulvovaginitis 08/17/2021   Elevated lipids 08/17/2021   Pre-diabetes 08/17/2021   Obesity (BMI 35.0-39.9 without comorbidity)    Genital herpes    Bilateral chronic knee pain    Trigeminal neuralgia pain     Past Surgical History:  Procedure Laterality Date   WISDOM TOOTH EXTRACTION      OB History     Gravida  2   Para  1   Term  1   Preterm      AB  1   Living  1      SAB      IAB  1   Ectopic      Multiple      Live Births  1            Home Medications    Prior to Admission medications    Medication Sig Start Date End Date Taking? Authorizing Provider  hydrocortisone (ANUSOL-HC) 25 MG suppository Place 1 suppository (25 mg total) rectally 2 (two) times daily. 10/09/21  Yes Shirlee Latch, PA-C  levonorgestrel (MIRENA) 20 MCG/24HR IUD 1 each by Intrauterine route once.   Yes [provider]  carbamazepine (TEGRETOL) 200 MG tablet Take 1 tablet (200 mg total) by mouth 3 (three) times daily. 08/07/20 09/06/20  Tommie Sams, DO  SUMAtriptan (IMITREX) 50 MG tablet Take 1 tablet (50 mg total) by mouth once for 1 dose. May repeat in 2 hours if headache persists or recurs. 02/06/18 07/16/20  Tommie Sams, DO    Family History Family History  Problem Relation Age of Onset   Thyroid disease Father    Hyperlipidemia Father    Hypertension Father    Lung cancer Maternal Grandmother 42   Breast cancer Neg Hx    Ovarian cancer Neg Hx    Diabetes Neg Hx     Social History Social History   Tobacco Use   Smoking status: Never   Smokeless tobacco: Never  Vaping Use   Vaping Use: Never used  Substance Use Topics   Alcohol use: No   Drug use: No     Allergies   Patient has no known allergies.   Review of Systems Review of Systems  Constitutional:  Negative for appetite change, fatigue and fever.  Respiratory:  Negative for shortness of breath.   Cardiovascular:  Negative for chest pain.  Gastrointestinal:  Positive for anal bleeding and blood in stool. Negative for abdominal distention, abdominal pain, constipation, diarrhea, nausea, rectal pain and vomiting.  Genitourinary:  Negative for dysuria and hematuria.  Musculoskeletal:  Negative for back pain.  Skin:  Negative for wound.  Neurological:  Negative for dizziness and weakness.    Physical Exam Triage Vital Signs ED Triage Vitals  Enc Vitals Group     BP 10/09/21 1640 140/82     Pulse Rate 10/09/21 1640 85     Resp 10/09/21 1640 18     Temp 10/09/21 1640 98.8 F (37.1 C)     Temp Source 10/09/21  1640 Oral     SpO2 10/09/21 1640 100 %     Weight 10/09/21 1639 245 lb (111.1 kg)     Height 10/09/21 1639 5\' 10"  (1.778 m)     Head Circumference --      Peak Flow --      Pain Score 10/09/21 1639 0     Pain Loc --      Pain Edu? --      Excl. in GC? --    No data found.  Updated Vital Signs BP 140/82 (BP Location: Left Arm)   Pulse 85   Temp 98.8 F (37.1 C) (Oral)   Resp 18   Ht 5\' 10"  (1.778 m)   Wt 245 lb (111.1 kg)   SpO2 100%   BMI 35.15 kg/m      Physical Exam Vitals and nursing note reviewed.  Constitutional:      General: She is not in acute distress.    Appearance: Normal appearance. She is not ill-appearing or toxic-appearing.  HENT:     Head: Normocephalic and atraumatic.  Eyes:     General: No scleral icterus.       Right eye: No discharge.        Left eye: No discharge.     Conjunctiva/sclera: Conjunctivae normal.  Cardiovascular:     Rate and Rhythm: Normal rate and regular rhythm.     Heart sounds: Normal heart sounds.  Pulmonary:     Effort: Pulmonary effort is normal. No respiratory distress.     Breath sounds: Normal breath sounds.  Abdominal:     Palpations: Abdomen is soft.     Tenderness: There is no abdominal tenderness.  Genitourinary:    Comments: Multiple internal hemorrhoids which are engorged.  They are nontender.  No bleeding at this time. Musculoskeletal:     Cervical back: Neck supple.  Skin:    General: Skin is dry.  Neurological:     General: No focal deficit present.     Mental Status: She is alert. Mental status is at baseline.     Motor: No weakness.     Gait: Gait normal.  Psychiatric:        Mood and Affect: Mood normal.        Behavior: Behavior normal.        Thought Content: Thought content normal.     UC Treatments / Results  Labs (all labs ordered are listed, but only abnormal results are displayed) Labs Reviewed - No  data to display  EKG   Radiology No results found.  Procedures Procedures  (including critical care time)  Medications Ordered in UC Medications - No data to display  Initial Impression / Assessment and Plan / UC Course  I have reviewed the triage vital signs and the nursing notes.  Pertinent labs & imaging results that were available during my care of the patient were reviewed by me and considered in my medical decision making (see chart for details).   35 year old female presenting for bright red blood per rectum for the past few days.  Admits to clots/clots of blood that come out with BMs.  No actual blood in the stool.  No associated fever, fatigue, abdominal pain.  History of hemorrhoids.  On exam she does have internal hemorrhoids which are engorged, not bleeding and nontender.  Suspect the bright red blood is due to the hemorrhoids.  I have sent in Anusol and placed a referral to GI at this time.  Warm sitz bath's.  Exercise and plenty of fiber in diet.  Advised patient to go to emergency department if she develops a fever, fatigue/lethargy, abdominal pain or rectal pain.  Follow-up here as needed.  Final Clinical Impressions(s) / UC Diagnoses   Final diagnoses:  Bright red blood per rectum  Internal hemorrhoid, bleeding     Discharge Instructions      -Your rectal bleeding is coming from the hemorrhoids which are extremely swollen.  I have sent Anusol suppositories for you to use to help with swelling. -Practice warm soaks/sitz bath's. -I placed a referral to GI for you as well.  It looks like your internal hemorrhoids have prolapsed somewhat. -If you start to have any associated fever, abdominal pain or rectal pain or severe bleeding or fatigue you should go to emergency department.     ED Prescriptions     Medication Sig Dispense Auth. Provider   hydrocortisone (ANUSOL-HC) 25 MG suppository Place 1 suppository (25 mg total) rectally 2 (two) times daily. 12 suppository Shirlee Latch, PA-C      PDMP not reviewed this encounter.   Shirlee Latch, PA-C 10/09/21 1723

## 2021-10-09 NOTE — Discharge Instructions (Addendum)
-  Your rectal bleeding is coming from the hemorrhoids which are extremely swollen.  I have sent Anusol suppositories for you to use to help with swelling. -Practice warm soaks/sitz bath's. -I placed a referral to GI for you as well.  It looks like your internal hemorrhoids have prolapsed somewhat. -If you start to have any associated fever, abdominal pain or rectal pain or severe bleeding or fatigue you should go to emergency department.

## 2021-10-09 NOTE — ED Triage Notes (Signed)
Pt here with C/O blood clots coming out every time she uses the bathroom since Monday. No other SX

## 2021-11-09 ENCOUNTER — Encounter: Payer: Self-pay | Admitting: Obstetrics and Gynecology

## 2021-11-30 ENCOUNTER — Encounter: Payer: Self-pay | Admitting: Emergency Medicine

## 2021-11-30 ENCOUNTER — Other Ambulatory Visit: Payer: Self-pay

## 2021-11-30 ENCOUNTER — Ambulatory Visit: Admit: 2021-11-30 | Payer: 59

## 2021-11-30 ENCOUNTER — Ambulatory Visit
Admission: EM | Admit: 2021-11-30 | Discharge: 2021-11-30 | Disposition: A | Discharge status: Home / Self Care | Payer: 59 | Attending: Physician Assistant | Admitting: Physician Assistant

## 2021-11-30 DIAGNOSIS — J09X2 Influenza due to identified novel influenza A virus with other respiratory manifestations: Secondary | ICD-10-CM | POA: Diagnosis present

## 2021-11-30 DIAGNOSIS — Z20822 Contact with and (suspected) exposure to covid-19: Secondary | ICD-10-CM | POA: Insufficient documentation

## 2021-11-30 LAB — RESP PANEL BY RT-PCR (FLU A&B, COVID) ARPGX2
Influenza A by PCR: POSITIVE — AB
Influenza B by PCR: NEGATIVE
SARS Coronavirus 2 by RT PCR: NEGATIVE

## 2021-11-30 MED ORDER — OSELTAMIVIR PHOSPHATE 75 MG PO CAPS: 75.0000 mg | ORAL_CAPSULE | Freq: Two times a day (BID) | ORAL | 0 refills | Status: DC

## 2021-11-30 MED ORDER — PROMETHAZINE-PHENYLEPHRINE 6.25-5 MG/5ML PO SYRP: 5.0000 mL | ORAL_SOLUTION | ORAL | 0 refills | Status: DC | PRN

## 2021-11-30 NOTE — ED Provider Notes (Addendum)
[facility]    CSN: [id number] Arrival date & time: 11/30/21  1539      History   Chief Complaint Chief Complaint  Patient presents with   Cough   Emesis    HPI Sarah Bryant is a 35 y.o. female who presents with onset of HA, chills, body aches and nose congestion, rhinitis, and vomiting x 2 yesterday. Denies having a fever, but has been chilling. Has had her Flu shot this season.  Denies nausea right now.     Past Medical History:  Diagnosis Date   Bilateral chronic knee pain    Genital herpes    History of Papanicolaou smear of cervix 01/18/12; 05/26/16   -/-; -/- ct/gc neg;   Obesity (BMI 35.0-39.9 without comorbidity)    Trigeminal neuralgia pain     Patient Active Problem List   Diagnosis Date Noted   Herpes simplex vulvovaginitis 08/17/2021   Elevated lipids 08/17/2021   Pre-diabetes 08/17/2021   Obesity (BMI 35.0-39.9 without comorbidity)    Genital herpes    Bilateral chronic knee pain    Trigeminal neuralgia pain     Past Surgical History:  Procedure Laterality Date   WISDOM TOOTH EXTRACTION      OB History     Gravida  2   Para  1   Term  1   Preterm      AB  1   Living  1      SAB      IAB  1   Ectopic      Multiple      Live Births  1            Home Medications    Prior to Admission medications   Medication Sig Start Date End Date Taking? Authorizing Provider  levonorgestrel (MIRENA) 20 MCG/24HR IUD 1 each by Intrauterine route once.   Yes [provider]  carbamazepine (TEGRETOL) 200 MG tablet Take 1 tablet (200 mg total) by mouth 3 (three) times daily. 08/07/20 09/06/20  Coral Spikes, DO  hydrocortisone (ANUSOL-HC) 25 MG suppository Place 1 suppository (25 mg total) rectally 2 (two) times daily. 10/09/21   Danton Clap, PA-C  SUMAtriptan (IMITREX) 50 MG tablet Take 1 tablet (50 mg total) by mouth once for 1 dose. May repeat in 2 hours if headache persists or recurs. 02/06/18 07/16/20  Coral Spikes, DO    Family History Family History  Problem Relation Age of Onset   Thyroid disease Father    Hyperlipidemia Father    Hypertension Father    Lung cancer Maternal Grandmother 46   Breast cancer Neg Hx    Ovarian cancer Neg Hx    Diabetes Neg Hx     Social History Social History   Tobacco Use   Smoking status: Never   Smokeless tobacco: Never  Vaping Use   Vaping Use: Never used  Substance Use Topics   Alcohol use: No   Drug use: No     Allergies   Patient has no known allergies.   Review of Systems Review of Systems  Constitutional:  Positive for chills and fatigue. Negative for appetite change, diaphoresis and fever.  HENT:  Positive for postnasal drip and sore throat. Negative for congestion, ear discharge, ear pain, rhinorrhea and trouble swallowing.   Eyes:  Negative for discharge.  Respiratory:  Positive for cough. Negative for shortness of breath and wheezing.   Gastrointestinal:  Positive for nausea and vomiting. Negative for  abdominal pain and diarrhea.  Musculoskeletal:  Positive for myalgias.  Skin:  Negative for rash.  Neurological:  Positive for headaches.  Hematological:  Negative for adenopathy.    Physical Exam Triage Vital Signs ED Triage Vitals  Enc Vitals Group     BP 11/30/21 1701 130/86     Pulse Rate 11/30/21 1701 (!) 103     Resp 11/30/21 1701 20     Temp 11/30/21 1701 99.1 F (37.3 C)     Temp Source 11/30/21 1701 Oral     SpO2 11/30/21 1701 97 %     Weight 11/30/21 1658 244 lb 14.9 oz (111.1 kg)     Height 11/30/21 1658 5\' 10"  (1.778 m)     Head Circumference --      Peak Flow --      Pain Score 11/30/21 1658 6     Pain Loc --      Pain Edu? --      Excl. in GC? --    No data found.  Updated Vital Signs BP 130/86 (BP Location: Left Arm)   Pulse (!) 103   Temp 99.1 F (37.3 C) (Oral)   Resp 20   Ht 5\' 10"  (1.778 m)   Wt 244 lb 14.9 oz (111.1 kg)   SpO2 97%   BMI 35.14 kg/m   Visual Acuity Right Eye  Distance:   Left Eye Distance:   Bilateral Distance:    Right Eye Near:   Left Eye Near:    Bilateral Near:      Physical Exam Vitals signs and nursing note reviewed.  Constitutional:      General: She is not in acute distress.    Appearance: Normal appearance. She is not ill-appearing, toxic-appearing or diaphoretic.  HENT:     Head: Normocephalic.     Right Ear: Tympanic membrane, ear canal and external ear normal.     Left Ear: Tympanic membrane, ear canal and external ear normal.     Nose: Nose normal.     Mouth/Throat:     Mouth: Mucous membranes are moist.  Eyes:     General: No scleral icterus.       Right eye: No discharge.        Left eye: No discharge.     Conjunctiva/sclera: Conjunctivae normal.  Neck:     Musculoskeletal: Neck supple. No neck rigidity.  Cardiovascular:     Rate and Rhythm: Normal rate and regular rhythm.     Heart sounds: No murmur.  Pulmonary:     Effort: Pulmonary effort is normal.     Breath sounds: Normal breath sounds.  Abdominal:     General: Bowel sounds are normal. There is no distension.     Palpations: Abdomen is soft. There is no mass.     Tenderness: There is no abdominal tenderness. There is no guarding or rebound.     Hernia: No hernia is present.  Musculoskeletal: Normal range of motion.  Lymphadenopathy:     Cervical: No cervical adenopathy.  Skin:    General: Skin is warm and dry.     Coloration: Skin is not jaundiced.     Findings: No rash.  Neurological:     Mental Status: She is alert and oriented to person, place, and time.     Gait: Gait normal.  Psychiatric:        Mood and Affect: Mood normal.        Behavior: Behavior normal.  Thought Content: Thought content normal.        Judgment: Judgment normal.    UC Treatments / Results  Labs (all labs ordered are listed, but only abnormal results are displayed) Labs Reviewed  RESP PANEL BY RT-PCR (FLU A&B, COVID) [id number]   Flu A- positive Fly B and covid-  neg  EKG   Radiology No results found.  Procedures Procedures (including critical care time)  Medications Ordered in UC Medications - No data to display  Initial Impression / Assessment and Plan / UC Course  I have reviewed the triage vital signs and the nursing notes. Pertinent labs  results that were available during my care of the patient were reviewed by me and considered in my medical decision making (see chart for details).  Has Flu A I placed her on Tessalon and Tamiflu as noted Final Clinical Impressions(s) / UC Diagnoses   Final diagnoses:  None   Discharge Instructions   None    ED Prescriptions   None    PDMP not reviewed this encounter.   Shelby Mattocks, PA-C 11/30/21 1805    Rodriguez-Southworth, [place], PA-C 11/30/21 1812

## 2021-11-30 NOTE — ED Triage Notes (Signed)
Pt c/o cough, headache, chills, vomiting, nasal congestion, runny nose. Started yesterday. Denies fever.

## 2021-12-03 ENCOUNTER — Ambulatory Visit: Payer: 59 | Admitting: Gastroenterology

## 2022-03-03 ENCOUNTER — Ambulatory Visit
Admission: EM | Admit: 2022-03-03 | Discharge: 2022-03-03 | Disposition: A | Discharge status: Home / Self Care | Payer: 59 | Attending: Physician Assistant | Admitting: Physician Assistant

## 2022-03-03 DIAGNOSIS — N3001 Acute cystitis with hematuria: Secondary | ICD-10-CM | POA: Diagnosis present

## 2022-03-03 DIAGNOSIS — N760 Acute vaginitis: Secondary | ICD-10-CM | POA: Diagnosis present

## 2022-03-03 LAB — URINALYSIS, ROUTINE W REFLEX MICROSCOPIC
Bilirubin Urine: NEGATIVE
Glucose, UA: NEGATIVE mg/dL
Ketones, ur: NEGATIVE mg/dL
Nitrite: NEGATIVE
Protein, ur: >300 mg/dL — AB
Specific Gravity, Urine: 1.025 (ref 1.005–1.030)
pH: 7 (ref 5.0–8.0)

## 2022-03-03 LAB — URINALYSIS, MICROSCOPIC (REFLEX): RBC / HPF: >50 RBC/hpf (ref 0–5)

## 2022-03-03 LAB — WET PREP, GENITAL
Sperm: NONE SEEN
Trich, Wet Prep: NONE SEEN
WBC, Wet Prep HPF POC: <10 — AB (ref <10)

## 2022-03-03 MED ORDER — METRONIDAZOLE 500 MG PO TABS: 500.0000 mg | ORAL_TABLET | Freq: Two times a day (BID) | ORAL | 0 refills | Status: AC

## 2022-03-03 MED ORDER — PHENAZOPYRIDINE HCL 200 MG PO TABS: 200.0000 mg | ORAL_TABLET | Freq: Three times a day (TID) | ORAL | 0 refills | Status: DC

## 2022-03-03 MED ORDER — NITROFURANTOIN MONOHYD MACRO 100 MG PO CAPS: 100.0000 mg | ORAL_CAPSULE | Freq: Two times a day (BID) | ORAL | 0 refills | Status: DC

## 2022-03-03 MED ORDER — FLUCONAZOLE 150 MG PO TABS: ORAL_TABLET | ORAL | 0 refills | Status: DC

## 2022-03-03 NOTE — Discharge Instructions (Signed)
UTI: Based on either symptoms or urinalysis, you may have a urinary tract infection. We will send the urine for culture and call with results in a few days. Begin antibiotics at this time. Your symptoms should be much improved over the next 2-3 days. Increase rest and fluid intake. If for some reason symptoms are worsening or not improving after a couple of days or the urine culture determines the antibiotics you are taking will not treat the infection, the antibiotics may be changed. Return or go to ER for fever, back pain, worsening urinary pain, discharge, increased blood in urine. May take Tylenol or Motrin OTC for pain relief or consider AZO if no contraindications  ? ?You also had BV and a yeast infection.  I have sent medication for those infections as well.  The results of your STI testing will be available later tonight or tomorrow.  We will call you with any positives. ?

## 2022-03-03 NOTE — ED Triage Notes (Signed)
Pt here with C/O odor, pain with urination for 2 days. Would like to be tested for GC, yeast, and trich.  ?

## 2022-03-03 NOTE — ED Provider Notes (Signed)
[facility]    CSN: [id number] Arrival date & time: 03/03/22  1717      History   Chief Complaint Chief Complaint  Patient presents with   Dysuria    HPI Sarah Bryant is a 36 y.o. female presenting for 2-day history of dysuria, urinary frequency and urgency, urinary odor, hematuria.  Patient denies vaginal discharge, itching or odor.  However she would like to be tested for yeast, BV, GC/chlamydia/trichomoniasis.  Denies any known exposure to STIs.  Denies fever.  Admits to some right-sided flank pain.  No other complaints.  HPI  Past Medical History:  Diagnosis Date   Bilateral chronic knee pain    Genital herpes    History of Papanicolaou smear of cervix 01/18/12; 05/26/16   -/-; -/- ct/gc neg;   Obesity (BMI 35.0-39.9 without comorbidity)    Trigeminal neuralgia pain     Patient Active Problem List   Diagnosis Date Noted   Herpes simplex vulvovaginitis 08/17/2021   Elevated lipids 08/17/2021   Pre-diabetes 08/17/2021   Obesity (BMI 35.0-39.9 without comorbidity)    Genital herpes    Bilateral chronic knee pain    Trigeminal neuralgia pain     Past Surgical History:  Procedure Laterality Date   WISDOM TOOTH EXTRACTION      OB History     Gravida  2   Para  1   Term  1   Preterm      AB  1   Living  1      SAB      IAB  1   Ectopic      Multiple      Live Births  1            Home Medications    Prior to Admission medications   Medication Sig Start Date End Date Taking? Authorizing Provider  fluconazole (DIFLUCAN) 150 MG tablet Take 1 tablet p.o. every 72 hours as needed yeast infection 03/03/22  Yes Shirlee LatchEaves, Ruqayya Ventress B, PA-C  levonorgestrel (MIRENA) 20 MCG/24HR IUD 1 each by Intrauterine route once.   Yes [provider]  metroNIDAZOLE (FLAGYL) 500 MG tablet Take 1 tablet (500 mg total) by mouth 2 (two) times daily for 7 days. 03/03/22 03/10/22 Yes Shirlee LatchEaves, Omega Slager B, PA-C  nitrofurantoin, macrocrystal-monohydrate,  (MACROBID) 100 MG capsule Take 1 capsule (100 mg total) by mouth 2 (two) times daily. 03/03/22  Yes Shirlee LatchEaves, Hannalee Castor B, PA-C  phenazopyridine (PYRIDIUM) 200 MG tablet Take 1 tablet (200 mg total) by mouth 3 (three) times daily. 03/03/22  Yes Shirlee LatchEaves, Valli Randol B, PA-C  oseltamivir (TAMIFLU) 75 MG capsule Take 1 capsule (75 mg total) by mouth every 12 (twelve) hours. 11/30/21   Rodriguez-Southworth, Nettie ElmSylvia, PA-C  promethazine-phenylephrine (PROMETHAZINE VC) 6.25-5 MG/5ML SYRP Take 5 mLs by mouth every 4 (four) hours as needed for congestion. 11/30/21   Rodriguez-Southworth, Nettie ElmSylvia, PA-C  SUMAtriptan (IMITREX) 50 MG tablet Take 1 tablet (50 mg total) by mouth once for 1 dose. May repeat in 2 hours if headache persists or recurs. 02/06/18 07/16/20  Tommie Samsook, Jayce G, DO    Family History Family History  Problem Relation Age of Onset   Thyroid disease Father    Hyperlipidemia Father    Hypertension Father    Lung cancer Maternal Grandmother [id number]   Breast cancer Neg Hx    Ovarian cancer Neg Hx    Diabetes Neg Hx     Social History Social History   Tobacco Use   Smoking status:  Never   Smokeless tobacco: Never  Vaping Use   Vaping Use: Never used  Substance Use Topics   Alcohol use: No   Drug use: No     Allergies   Patient has no known allergies.   Review of Systems Review of Systems  Constitutional:  Negative for chills, fatigue and fever.  Gastrointestinal:  Negative for abdominal pain, diarrhea, nausea and vomiting.  Genitourinary:  Positive for dysuria, flank pain, frequency, hematuria and urgency. Negative for decreased urine volume, pelvic pain, vaginal bleeding, vaginal discharge and vaginal pain.  Musculoskeletal:  Negative for back pain.  Skin:  Negative for rash.    Physical Exam Triage Vital Signs ED Triage Vitals  Enc Vitals Group     BP 03/03/22 1742 134/80     Pulse Rate 03/03/22 1742 79     Resp 03/03/22 1742 18     Temp 03/03/22 1742 99 F (37.2 C)     Temp src --       SpO2 03/03/22 1742 98 %     Weight 03/03/22 1741 250 lb (113.4 kg)     Height 03/03/22 1741  (1.778 m)     Head Circumference --      Peak Flow --      Pain Score 03/03/22 1738 8     Pain Loc --      Pain Edu? --      Excl. in GC? --    No data found.  Updated Vital Signs BP 134/80    Pulse 79    Temp 99 F (37.2 C)    Resp 18    Ht  (1.778 m)    Wt 250 lb (113.4 kg)    SpO2 98%    BMI 35.87 kg/m      Physical Exam Vitals and nursing note reviewed.  Constitutional:      General: She is not in acute distress.    Appearance: Normal appearance. She is not ill-appearing or toxic-appearing.  HENT:     Head: Normocephalic and atraumatic.  Eyes:     General: No scleral icterus.       Right eye: No discharge.        Left eye: No discharge.     Conjunctiva/sclera: Conjunctivae normal.  Cardiovascular:     Rate and Rhythm: Normal rate and regular rhythm.     Heart sounds: Normal heart sounds.  Pulmonary:     Effort: Pulmonary effort is normal. No respiratory distress.     Breath sounds: Normal breath sounds.  Abdominal:     Palpations: Abdomen is soft.     Tenderness: There is abdominal tenderness (suprapubic). There is no right CVA tenderness or left CVA tenderness.  Musculoskeletal:     Cervical back: Neck supple.  Skin:    General: Skin is dry.  Neurological:     General: No focal deficit present.     Mental Status: She is alert. Mental status is at baseline.     Motor: No weakness.     Gait: Gait normal.  Psychiatric:        Mood and Affect: Mood normal.        Behavior: Behavior normal.        Thought Content: Thought content normal.     UC Treatments / Results  Labs (all labs ordered are listed, but only abnormal results are displayed) Labs Reviewed  WET PREP, GENITAL - Abnormal; Notable for the following components:  Result Value   Yeast Wet Prep HPF POC PRESENT (*)    Clue Cells Wet Prep HPF POC PRESENT (*)    WBC, Wet Prep HPF POC <10 (*)     All other components within normal limits  URINALYSIS, ROUTINE W REFLEX MICROSCOPIC - Abnormal; Notable for the following components:   APPearance CLOUDY (*)    Hgb urine dipstick MODERATE (*)    Protein, ur >300 (*)    Leukocytes,Ua SMALL (*)    All other components within normal limits  URINALYSIS, MICROSCOPIC (REFLEX) - Abnormal; Notable for the following components:   Bacteria, UA MANY (*)    All other components within normal limits  URINE CULTURE  CERVICOVAGINAL ANCILLARY ONLY    EKG   Radiology No results found.  Procedures Procedures (including critical care time)  Medications Ordered in UC Medications - No data to display  Initial Impression / Assessment and Plan / UC Course  I have reviewed the triage vital signs and the nursing notes.  Pertinent labs & imaging results that were available during my care of the patient were reviewed by me and considered in my medical decision making (see chart for details).  36 year old female presenting for dysuria, urinary frequency and urgency, hematuria, urinary odor, right flank pain.  Wet prep shows positive yeast and clue cells.  UA shows cloudy urine, moderate hemoglobin and small besides.  Also many bacteria.  We will send urine for culture.  GC/chlamydia testing obtained.  Treating patient for UTI, BV and yeast.  Sent metronidazole, Macrobid, Diflucan and Pyridium.  Advised her to increase rest and fluids.  Reviewed how to access results of STI testing and explained we will call with any positives.  Follow-up as needed.   Final Clinical Impressions(s) / UC Diagnoses   Final diagnoses:  Acute cystitis with hematuria  Acute vaginitis     Discharge Instructions      UTI: Based on either symptoms or urinalysis, you may have a urinary tract infection. We will send the urine for culture and call with results in a few days. Begin antibiotics at this time. Your symptoms should be much improved over the next 2-3 days.  Increase rest and fluid intake. If for some reason symptoms are worsening or not improving after a couple of days or the urine culture determines the antibiotics you are taking will not treat the infection, the antibiotics may be changed. Return or go to ER for fever, back pain, worsening urinary pain, discharge, increased blood in urine. May take Tylenol or Motrin OTC for pain relief or consider AZO if no contraindications   You also had BV and a yeast infection.  I have sent medication for those infections as well.  The results of your STI testing will be available later tonight or tomorrow.  We will call you with any positives.     ED Prescriptions     Medication Sig Dispense Auth. Provider   metroNIDAZOLE (FLAGYL) 500 MG tablet Take 1 tablet (500 mg total) by mouth 2 (two) times daily for 7 days. 14 tablet Eusebio Friendly B, PA-C   nitrofurantoin, macrocrystal-monohydrate, (MACROBID) 100 MG capsule Take 1 capsule (100 mg total) by mouth 2 (two) times daily. 10 capsule Eusebio Friendly B, PA-C   fluconazole (DIFLUCAN) 150 MG tablet Take 1 tablet p.o. every 72 hours as needed yeast infection 2 tablet Eusebio Friendly B, PA-C   phenazopyridine (PYRIDIUM) 200 MG tablet Take 1 tablet (200 mg total) by mouth 3 (three) times daily.  6 tablet Gareth Morgan      PDMP not reviewed this encounter.   Shirlee Latch, PA-C 03/03/22 1829

## 2022-03-04 LAB — CERVICOVAGINAL ANCILLARY ONLY
Chlamydia: NEGATIVE
Comment: NEGATIVE
Comment: NORMAL
Neisseria Gonorrhea: NEGATIVE

## 2022-03-06 LAB — URINE CULTURE: Culture: >=100000 — AB

## 2022-04-22 ENCOUNTER — Other Ambulatory Visit: Payer: Self-pay

## 2022-04-22 ENCOUNTER — Other Ambulatory Visit: Payer: Self-pay | Admitting: Obstetrics and Gynecology

## 2022-04-22 DIAGNOSIS — A6004 Herpesviral vulvovaginitis: Secondary | ICD-10-CM

## 2022-05-31 ENCOUNTER — Other Ambulatory Visit: Payer: Self-pay | Admitting: Advanced Practice Midwife

## 2022-05-31 DIAGNOSIS — A6000 Herpesviral infection of urogenital system, unspecified: Secondary | ICD-10-CM

## 2022-05-31 MED ORDER — VALACYCLOVIR HCL 500 MG PO TABS: 500.0000 mg | ORAL_TABLET | Freq: Two times a day (BID) | ORAL | 6 refills | Status: AC | PRN

## 2022-05-31 NOTE — Progress Notes (Signed)
Rx valtrex sent per patient request 

## 2022-06-01 ENCOUNTER — Other Ambulatory Visit: Payer: Self-pay | Admitting: Advanced Practice Midwife

## 2022-06-01 DIAGNOSIS — A6000 Herpesviral infection of urogenital system, unspecified: Secondary | ICD-10-CM

## 2022-07-26 IMAGING — CR DG CHEST 2V
2 series · 2 of 2 positions shown · non-contrast
Comparison: None.

CLINICAL DATA: Suspected sepsis.  Body aches, fever

EXAM:
CHEST - 2 VIEW

[chest pa]
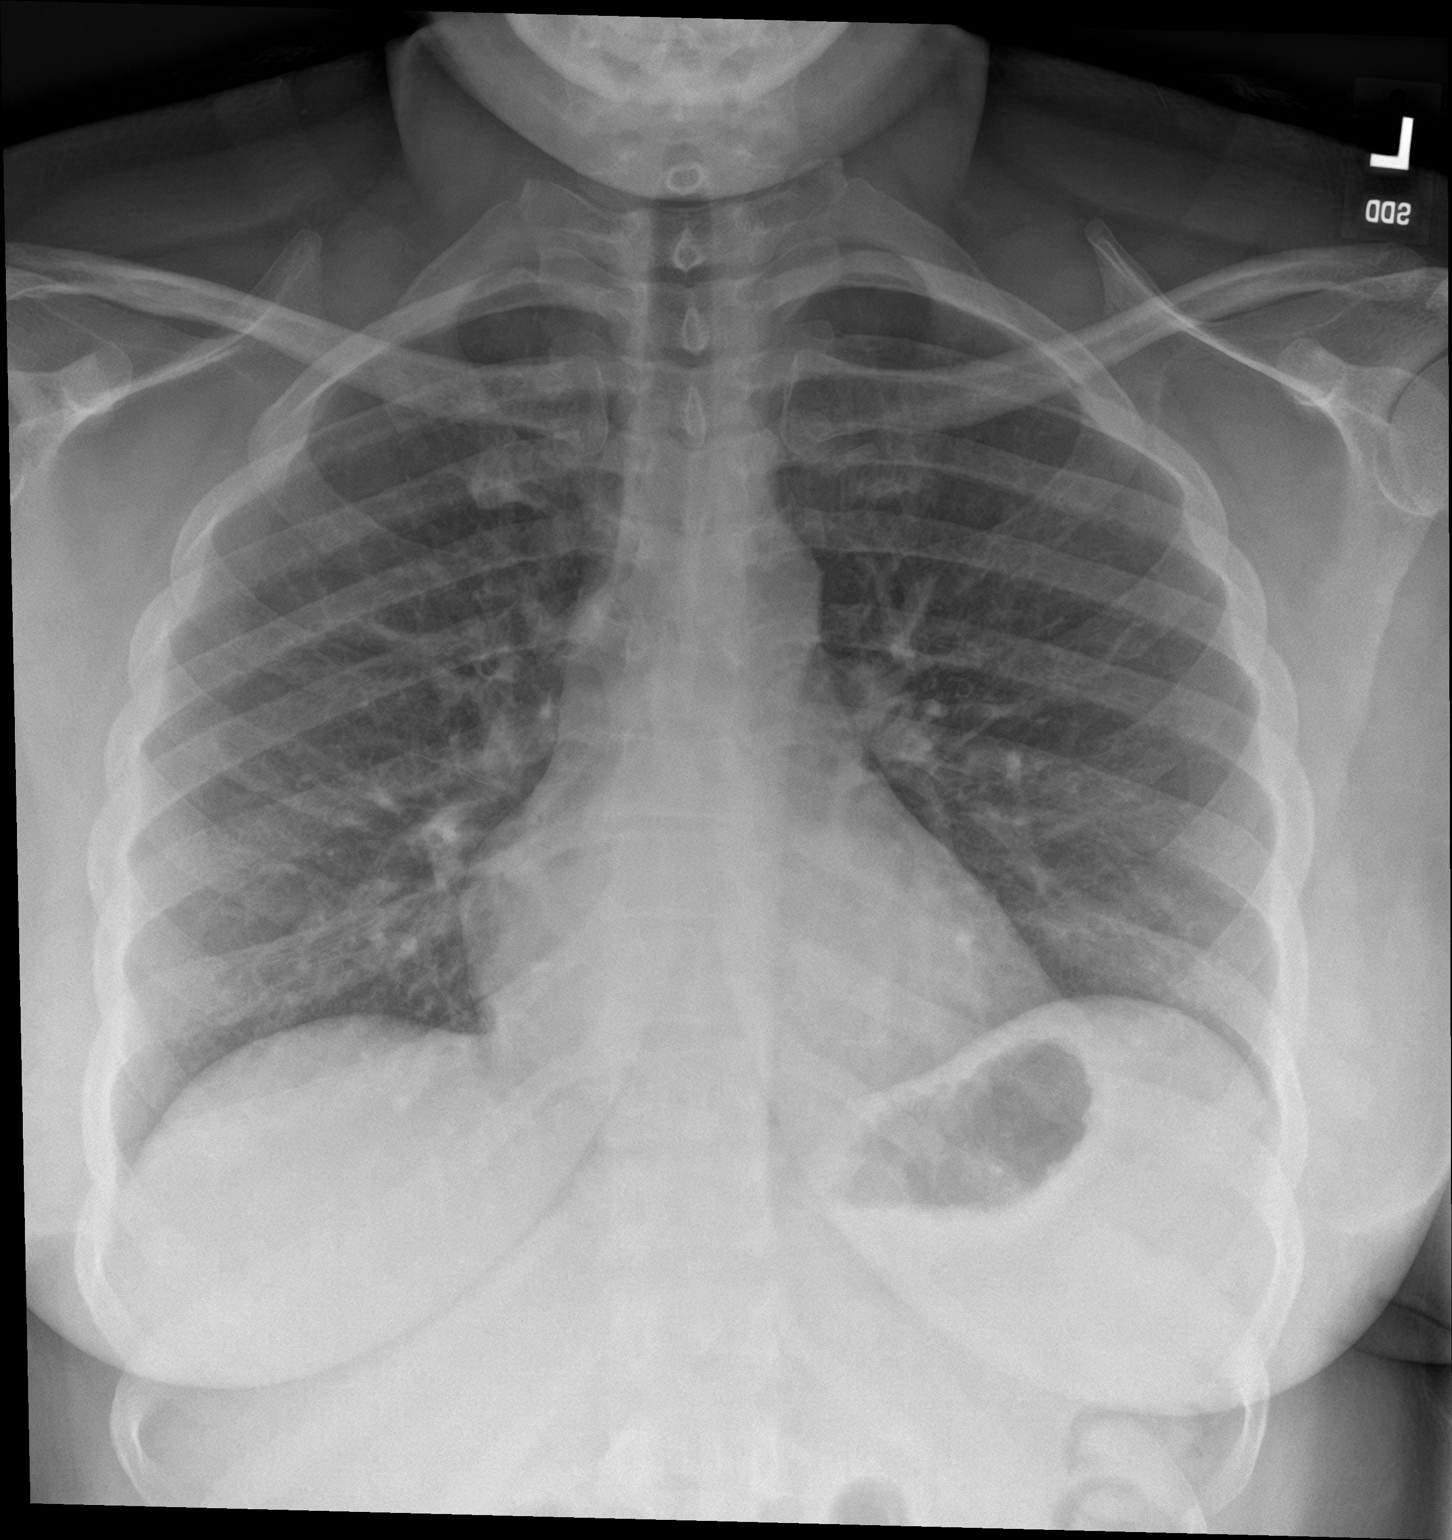

[chest lat]
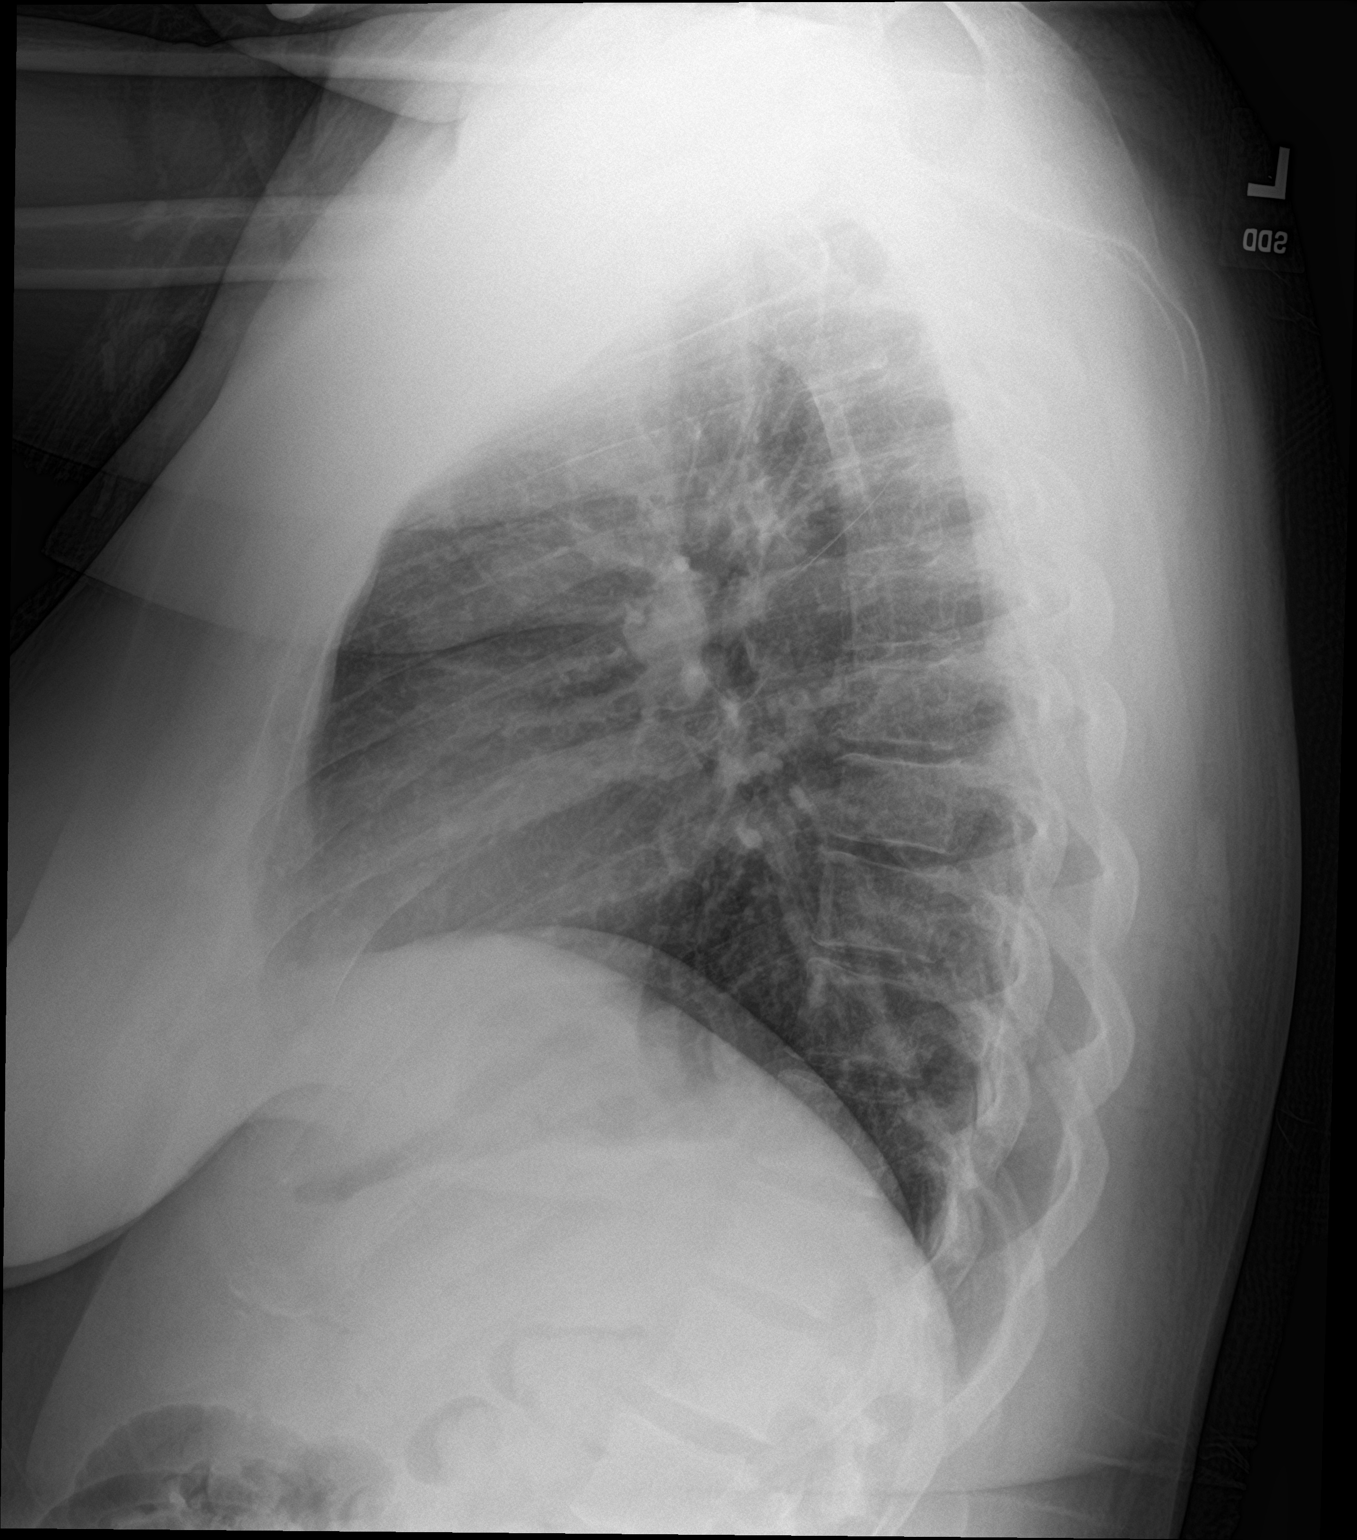

[2 of 2 positions shown; findings below may reference images not displayed]

FINDINGS: Heart and mediastinal contours are within normal limits. No focal
opacities or effusions. No acute bony abnormality.
IMPRESSION: No active cardiopulmonary disease.

## 2022-08-25 ENCOUNTER — Ambulatory Visit
Admission: RE | Admit: 2022-08-25 | Discharge: 2022-08-25 | Disposition: A | Discharge status: Home / Self Care | Payer: BC Managed Care – PPO | Source: Ambulatory Visit | Attending: Emergency Medicine | Admitting: Emergency Medicine

## 2022-08-25 VITALS — BP 151/80 | HR 83 | Temp 99.1°F | Ht 70.0 in | Wt 240.0 lb

## 2022-08-25 DIAGNOSIS — B9689 Other specified bacterial agents as the cause of diseases classified elsewhere: Secondary | ICD-10-CM | POA: Diagnosis present

## 2022-08-25 DIAGNOSIS — Z113 Encounter for screening for infections with a predominantly sexual mode of transmission: Secondary | ICD-10-CM | POA: Insufficient documentation

## 2022-08-25 DIAGNOSIS — N760 Acute vaginitis: Secondary | ICD-10-CM | POA: Diagnosis present

## 2022-08-25 LAB — URINALYSIS, ROUTINE W REFLEX MICROSCOPIC
Bilirubin Urine: NEGATIVE
Glucose, UA: NEGATIVE mg/dL
Ketones, ur: NEGATIVE mg/dL
Leukocytes,Ua: NEGATIVE
Nitrite: NEGATIVE
Protein, ur: NEGATIVE mg/dL
Specific Gravity, Urine: 1.02 (ref 1.005–1.030)
pH: 7 (ref 5.0–8.0)

## 2022-08-25 LAB — URINALYSIS, MICROSCOPIC (REFLEX)

## 2022-08-25 LAB — WET PREP, GENITAL
Sperm: NONE SEEN
Trich, Wet Prep: NONE SEEN
WBC, Wet Prep HPF POC: <10 — AB (ref <10)
Yeast Wet Prep HPF POC: NONE SEEN

## 2022-08-25 MED ORDER — METRONIDAZOLE 500 MG PO TABS: 500.0000 mg | ORAL_TABLET | Freq: Two times a day (BID) | ORAL | 0 refills | Status: AC

## 2022-08-25 NOTE — ED Provider Notes (Signed)
HPI  SUBJECTIVE:  Sarah Bryant is a 36 y.o. female who presents with urinary frequency, odorous urine, vaginal odor for the past week.  She is requesting STD testing.  No dysuria, urgency, cloudy urine, hematuria, vaginal discharge, genital rash or itching.  No vomiting, abdominal, back, pelvic pain.  She has a single female partner.  She is not sure if he is having any symptoms.  She does not know if he has any other partners.  There are no aggravating or alleviating factors.  She has not tried anything for her symptoms.  She has a past medical history of UTI, HSV, trichomonas, BV, yeast.  She has a remote history of gonorrhea or chlamydia, cannot remember which.  No history of HIV, syphilis, diabetes.  LMP: Has an IUD.  Denies the possibility of being pregnant.  PCP: None   Past Medical History:  Diagnosis Date   Bilateral chronic knee pain    Genital herpes    History of Papanicolaou smear of cervix 01/18/12; 05/26/16   -/-; -/- ct/gc neg;   Obesity (BMI 35.0-39.9 without comorbidity)    Trigeminal neuralgia pain     Past Surgical History:  Procedure Laterality Date   WISDOM TOOTH EXTRACTION      Family History  Problem Relation Age of Onset   Thyroid disease Father    Hyperlipidemia Father    Hypertension Father    Lung cancer Maternal Grandmother 11   Breast cancer Neg Hx    Ovarian cancer Neg Hx    Diabetes Neg Hx     Social History   Tobacco Use   Smoking status: Never   Smokeless tobacco: Never  Vaping Use   Vaping Use: Never used  Substance Use Topics   Alcohol use: No   Drug use: No    No current facility-administered medications for this encounter.  Current Outpatient Medications:    levonorgestrel (MIRENA) 20 MCG/24HR IUD, 1 each by Intrauterine route once., Disp: , Rfl:    metroNIDAZOLE (FLAGYL) 500 MG tablet, Take 1 tablet (500 mg total) by mouth 2 (two) times daily for 7 days., Disp: 14 tablet, Rfl: 0  No Known Allergies   ROS  As noted in  HPI.   Physical Exam  BP (!) 151/80 (BP Location: Left Arm)   Pulse 83   Temp 99.1 F (37.3 C) (Oral)   Ht 5\' 10"  (1.778 m)   Wt 108.9 kg   SpO2 97%   BMI 34.44 kg/m   Constitutional: Well developed, well nourished, no acute distress Eyes:  EOMI, conjunctiva normal bilaterally HENT: Normocephalic, atraumatic,mucus membranes moist Respiratory: Normal inspiratory effort Cardiovascular: Normal rate GI: nondistended.  Soft.  No suprapubic, flank tenderness Back: No CVAT GU: Deferred skin: No rash, skin intact Musculoskeletal: no deformities Neurologic: Alert & oriented x 3, no focal neuro deficits Psychiatric: Speech and behavior appropriate   ED Course   Medications - No data to display  Orders Placed This Encounter  Procedures   Wet prep, genital    Standing Status:   Standing    Number of Occurrences:   1   Urinalysis, Routine w reflex microscopic    Standing Status:   Standing    Number of Occurrences:   1   Urinalysis, Microscopic (reflex)    Standing Status:   Standing    Number of Occurrences:   1    Results for orders placed or performed during the hospital encounter of 08/25/22 (from the past 24 hour(s))  Urinalysis, Routine  w reflex microscopic PATH Cytology Cervicovaginal Ancillary Only     Status: Abnormal   Collection Time: 08/25/22  1:42 PM  Result Value Ref Range   Color, Urine YELLOW YELLOW   APPearance CLEAR CLEAR   Specific Gravity, Urine 1.020 1.005 - 1.030   pH 7.0 5.0 - 8.0   Glucose, UA NEGATIVE NEGATIVE mg/dL   Hgb urine dipstick TRACE (A) NEGATIVE   Bilirubin Urine NEGATIVE NEGATIVE   Ketones, ur NEGATIVE NEGATIVE mg/dL   Protein, ur NEGATIVE NEGATIVE mg/dL   Nitrite NEGATIVE NEGATIVE   Leukocytes,Ua NEGATIVE NEGATIVE  Wet prep, genital     Status: Abnormal   Collection Time: 08/25/22  1:42 PM   Specimen: PATH Cytology Cervicovaginal Ancillary Only  Result Value Ref Range   Yeast Wet Prep HPF POC NONE SEEN NONE SEEN   Trich, Wet  Prep NONE SEEN NONE SEEN   Clue Cells Wet Prep HPF POC PRESENT (A) NONE SEEN   WBC, Wet Prep HPF POC <10 (A) <10   Sperm NONE SEEN   Urinalysis, Microscopic (reflex)     Status: Abnormal   Collection Time: 08/25/22  1:42 PM  Result Value Ref Range   RBC / HPF 0-5 0 - 5 RBC/hpf   WBC, UA 0-5 0 - 5 WBC/hpf   Bacteria, UA FEW (A) NONE SEEN   Squamous Epithelial / LPF 6-10 0 - 5   No results found.  ED Clinical Impression  1. BV (bacterial vaginosis)   2. Screening for STDs (sexually transmitted diseases)      ED Assessment/Plan  Wet prep positive for BV.  UA negative for UTI.  Swab for gonorrhea, chlamydia sent.  Patient declined HIV, RPR testing.  Home with Flagyl for a week.  Advised patient to refrain from intercourse until her symptoms have resolved,  all of her labs are resulted, she has finished treatment with Flagyl.  Will provide primary care list for primary routine care.  She declined the offer to set her up with a PCP here today.  Discussed labs,  MDM, treatment plan, and plan for follow-up with patient. patient agrees with plan.   Meds ordered this encounter  Medications   metroNIDAZOLE (FLAGYL) 500 MG tablet    Sig: Take 1 tablet (500 mg total) by mouth 2 (two) times daily for 7 days.    Dispense:  14 tablet    Refill:  0      *This clinic note was created using Scientist, clinical (histocompatibility and immunogenetics). Therefore, there may be occasional mistakes despite careful proofreading.  ?    Domenick Gong, MD 08/25/22 1537

## 2022-08-25 NOTE — ED Triage Notes (Signed)
Pt requesting STD screening, reports odor to urine x few days, some discharge

## 2022-08-25 NOTE — Discharge Instructions (Addendum)
Take the medication as written. Give [place] a working phone number so that we can contact you if needed. Refrain from sexual contact until all of your labs have come back, symptoms have resolved, and your partner(s) are treated if necessary. Return to the ER if you get worse, have a fever >100.4, or for any concerns.   Here is a list of primary care providers who are taking new patients:  [facility] [place] Dr. Joseph Berkshire (sports medicine) Dr. Elizabeth Sauer [address] [place] [address] [phone]  [facility] [address] [place], [place] [address] [phone]  [facility] [place] [address]  [place] [address]  [phone]  [facility] [address] [place], [place] [address] [phone]  [facility] [address]  [phone] [place], [place] [address]  Go to www.goodrx.com  or www.costplusdrugs.com to look up your medications. This will give you a list of where you can find your prescriptions at the most affordable prices. Or ask the pharmacist what the cash price is, or if they have any other discount programs available to help make your medication more affordable. This can be less expensive than what you would pay with insurance.

## 2022-08-27 LAB — CERVICOVAGINAL ANCILLARY ONLY
Chlamydia: NEGATIVE
Comment: NEGATIVE
Comment: NEGATIVE
Comment: NORMAL
Neisseria Gonorrhea: NEGATIVE
Trichomonas: NEGATIVE

## 2023-04-20 ENCOUNTER — Emergency Department: Payer: BC Managed Care – PPO

## 2023-04-20 ENCOUNTER — Other Ambulatory Visit: Payer: Self-pay

## 2023-04-20 ENCOUNTER — Emergency Department
Admission: EM | Admit: 2023-04-20 | Discharge: 2023-04-20 | Disposition: A | Discharge status: Home / Self Care | Payer: BC Managed Care – PPO | Attending: Emergency Medicine | Admitting: Emergency Medicine

## 2023-04-20 DIAGNOSIS — S4992XA Unspecified injury of left shoulder and upper arm, initial encounter: Secondary | ICD-10-CM | POA: Diagnosis present

## 2023-04-20 DIAGNOSIS — S20212A Contusion of left front wall of thorax, initial encounter: Secondary | ICD-10-CM | POA: Diagnosis not present

## 2023-04-20 DIAGNOSIS — S7002XA Contusion of left hip, initial encounter: Secondary | ICD-10-CM | POA: Diagnosis not present

## 2023-04-20 DIAGNOSIS — S40022A Contusion of left upper arm, initial encounter: Secondary | ICD-10-CM | POA: Diagnosis not present

## 2023-04-20 DIAGNOSIS — Y92410 Unspecified street and highway as the place of occurrence of the external cause: Secondary | ICD-10-CM | POA: Diagnosis not present

## 2023-04-20 DIAGNOSIS — S0990XA Unspecified injury of head, initial encounter: Secondary | ICD-10-CM | POA: Insufficient documentation

## 2023-04-20 MED ORDER — LIDOCAINE 5 % EX PTCH
1.0000 | MEDICATED_PATCH | CUTANEOUS | Status: DC
  Administered 2023-04-20: 1 via TRANSDERMAL
  Filled 2023-04-20: qty 1

## 2023-04-20 MED ORDER — ACETAMINOPHEN 325 MG PO TABS
650.0000 mg | ORAL_TABLET | Freq: Once | ORAL | Status: AC
  Administered 2023-04-20: 650 mg via ORAL
  Filled 2023-04-20: qty 2

## 2023-04-20 NOTE — Discharge Instructions (Signed)
Your x-rays and CT scans were normal.  You may continue to take Tylenol/ibuprofen per package instructions to help with your symptoms.  Please return for any new, worsening, or change in symptoms or other concerns.  It was a pleasure caring for you today.
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
## 2023-09-08 ENCOUNTER — Ambulatory Visit (INDEPENDENT_AMBULATORY_CARE_PROVIDER_SITE_OTHER): Payer: BC Managed Care – PPO

## 2023-09-08 ENCOUNTER — Other Ambulatory Visit (HOSPITAL_COMMUNITY)
Admission: RE | Admit: 2023-09-08 | Discharge: 2023-09-08 | Disposition: A | Discharge status: Home / Self Care | Payer: BC Managed Care – PPO | Source: Ambulatory Visit

## 2023-09-08 VITALS — BP 120/78 | HR 72 | Ht 70.0 in | Wt 261.5 lb

## 2023-09-08 DIAGNOSIS — Z124 Encounter for screening for malignant neoplasm of cervix: Secondary | ICD-10-CM

## 2023-09-08 DIAGNOSIS — Z30432 Encounter for removal of intrauterine contraceptive device: Secondary | ICD-10-CM | POA: Diagnosis not present

## 2023-09-08 DIAGNOSIS — Z113 Encounter for screening for infections with a predominantly sexual mode of transmission: Secondary | ICD-10-CM

## 2023-09-08 DIAGNOSIS — Z01419 Encounter for gynecological examination (general) (routine) without abnormal findings: Secondary | ICD-10-CM

## 2023-09-08 DIAGNOSIS — Z3009 Encounter for other general counseling and advice on contraception: Secondary | ICD-10-CM

## 2023-09-08 MED ORDER — LEVONORGEST-ETH ESTRAD 91-DAY 0.15-0.03 &0.01 MG PO TABS
1.0000 | ORAL_TABLET | Freq: Every day | ORAL | 3 refills | Status: DC
Start: 2023-09-08 — End: 2024-01-04

## 2023-09-08 NOTE — Progress Notes (Addendum)
Outpatient Gynecology Note: Annual Visit  Assessment/Plan:    Sarah Bryant is a 37 y.o. female G65P1011 with normal well-woman gynecologic exam.   Well woman exam - Reviewed health maintenance topics as documented below. - Most recent pap smear in 2021, NILM without co-testing, pap smear collected today.. - STI screening accepted.  - Does not currently have a PCP but is working on getting established. TSH, CBC, and lipid panel collected today. - Reviewed options for contraception and after this discussion, she desires to trial continuous cycling OCPs. Rx provided. Mirena IUD removed, see procedure note below.      Orders Placed This Encounter  Procedures   HEP, RPR, HIV Panel   CBC   TSH+T4F+T3Free   Lipid panel   Current Outpatient Medications  Medication Instructions   Levonorgestrel-Ethinyl Estradiol (AMETHIA) 0.15-0.03 &0.01 MG tablet 1 tablet, Oral, Daily at bedtime   valACYclovir (VALTREX) 500 mg, Oral, Daily    No follow-ups on file.    Subjective:    Sarah Bryant is a 37 y.o. female G2P1011 who presents for annual wellness visit.   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 Desires removal of Mirena IUD today as her partner has been feeling the strings and she has had some discomfort with it. She does not desire pregnancy at this time, but her partner would like another child, so she desires a form of contraception that she can easily decide to discontinue if she chooses to pursue conception.   Well Woman Visit:  GYN HISTORY:  No LMP recorded. (Menstrual status: IUD).     Menstrual History: OB History     Gravida  2   Para  1   Term  1   Preterm      AB  1   Living  1      SAB      IAB  1   Ectopic      Multiple      Live Births  1           Menarche age: 78 No LMP recorded. (Menstrual status: IUD).   Intermenstrual bleeding, spotting, or discharge? Mild spotting recently with Mirena IUD Urinary  incontinence? no  Sexually active: yes Number of sexual partners: one Gender of sexual Partners: male, new partner Dyspareunia? no Last pap: 2021, NILM History of abnormal Pap: no Gardasil series:  no STI history: yes, years ago, unsure of which Contraceptive methods: Mirena UD currently, switching to OCPs.  Health Maintenance > Reviewed breast self-awareness, no family history of breast cancer > History of abnormal mammogram: n/a > Exercise: none, not active > Dietary Supplements: no > Body mass index is 37.52 kg/m.  > Recent dental visit Yes.   > Seat Belt Use: Yes.   > Texting and driving? No. > Guns in the house Yes.  ,locked > STI/HIV testing or immunizations needed? Yes.   > Concern for alcohol abuse? Occasional    Tobacco or other drug use: denied. Tobacco Use: Low Risk  (09/08/2023)   Patient History    Smoking Tobacco Use: Never    Smokeless Tobacco Use: Never    Passive Exposure: Not on file     PHQ-2 Score: In last two weeks, how often have you felt: Little interest or pleasure in doing things: Not at all (0) Feeling down, depressed or hopeless: Not at all (0) Score: 0  GAD-2 Over the last 2 weeks, how often have you been bothered  by the following problems? Feeling nervous, anxious or on edge: Not at all (0) Not being able to stop or control worrying: Not at all (0)} Score: 0   _________________________________________________________  Current Outpatient Medications  Medication Sig Dispense Refill   Levonorgestrel-Ethinyl Estradiol (AMETHIA) 0.15-0.03 &0.01 MG tablet Take 1 tablet by mouth at bedtime. 84 tablet 3   valACYclovir (VALTREX) 500 MG tablet Take 500 mg by mouth daily.     No current facility-administered medications for this visit.   No Known Allergies  Past Medical History:  Diagnosis Date   Bilateral chronic knee pain    Genital herpes    History of Papanicolaou smear of cervix 01/18/12; 05/26/16   -/-; -/- ct/gc neg;   Obesity  (BMI 35.0-39.9 without comorbidity)    Trigeminal neuralgia pain    Past Surgical History:  Procedure Laterality Date   WISDOM TOOTH EXTRACTION     OB History     Gravida  2   Para  1   Term  1   Preterm      AB  1   Living  1      SAB      IAB  1   Ectopic      Multiple      Live Births  1          Social History   Tobacco Use   Smoking status: Never   Smokeless tobacco: Never  Substance Use Topics   Alcohol use: No   Social History   Substance and Sexual Activity  Sexual Activity Yes   Partners: Male   Birth control/protection: I.U.D.   Comment: Mirena    Immunization History  Administered Date(s) Administered   PFIZER(Purple Top)SARS-COV-2 Vaccination 08/18/2020, 09/10/2020   Tdap 08/13/2020     Review Of Systems  Constitutional: Denied constitutional symptoms, night sweats, recent illness, fatigue, fever, insomnia and weight loss.  Eyes: Denied eye symptoms, eye pain, photophobia, vision change and visual disturbance.  Ears/Nose/Throat/Neck: Denied ear, nose, throat or neck symptoms, hearing loss, nasal discharge, sinus congestion and sore throat.  Cardiovascular: Denied cardiovascular symptoms, arrhythmia, chest pain/pressure, edema, exercise intolerance, orthopnea and palpitations.  Respiratory: Denied pulmonary symptoms, asthma, pleuritic pain, productive sputum, cough, dyspnea and wheezing.  Gastrointestinal: Denied, gastro-esophageal reflux, melena, nausea and vomiting.  Genitourinary: Denied genitourinary symptoms including symptomatic vaginal discharge, pelvic relaxation issues, and urinary complaints.  Musculoskeletal: Denied musculoskeletal symptoms, stiffness, swelling, muscle weakness and myalgia.  Dermatologic: Denied dermatology symptoms, rash and scar.  Neurologic: Denied neurology symptoms, dizziness, headache, neck pain and syncope.  Psychiatric: Denied psychiatric symptoms, anxiety and depression.  Endocrine: Denied  endocrine symptoms including hot flashes and night sweats.      Objective:    BP 120/78   Pulse 72   Ht 5\' 10"  (1.778 m)   Wt 261 lb 8 oz (118.6 kg)   BMI 37.52 kg/m   Constitutional: Well-developed, well-nourished female in no acute distress Neurological: Alert and oriented to person, place, and time Psychiatric: Mood and affect appropriate Skin: No rashes or lesions Neck: Supple without masses. Trachea is midline.Thyroid is normal size without masses Lymphatics: No cervical, axillary, supraclavicular, or inguinal adenopathy noted Respiratory: Clear to auscultation bilaterally. Good air movement with normal work of breathing. Cardiovascular: Regular rate and rhythm. Extremities grossly normal, nontender with no edema; pulses regular Gastrointestinal: Soft, nontender, nondistended. No masses or hernias appreciated. No hepatosplenomegaly. No fluid wave. No rebound or guarding. Breast Exam: deferred with shared decision making Genitourinary:  External Genitalia: Normal female genitalia    Vagina: well-rugated, no lesions.    Cervix: No lesions, normal size and consistency; no cervical motion tenderness, IUD strings visualized at cervical os prior to removal  Perineum/Anus: No lesions Rectal: deferred    IUD Removal  Patient identified, informed consent performed, consent signed.    Speculum placed in the vagina. The strings of the IUD were grasped and pulled using ring forceps. The IUD was successfully removed in its entirety.  Patient tolerated procedure well.    Sarah Bryant, CNM   Autumn Messing, CNM  09/08/23 3:31 PM

## 2023-09-08 NOTE — Assessment & Plan Note (Addendum)
-   Reviewed health maintenance topics as documented below. - Most recent pap smear in 2021, NILM without co-testing, pap smear collected today.. - STI screening accepted.  - Does not currently have a PCP but is working on getting established. TSH, CBC, and lipid panel collected today. - Reviewed options for contraception and after this discussion, she desires to trial continuous cycling OCPs. Rx provided. Mirena IUD removed, see procedure note below.

## 2023-09-09 LAB — CBC
Hematocrit: 40.2 % (ref 34.0–46.6)
Hemoglobin: 12.8 g/dL (ref 11.1–15.9)
MCH: 27.4 pg (ref 26.6–33.0)
MCHC: 31.8 g/dL (ref 31.5–35.7)
MCV: 86 fL (ref 79–97)
Platelets: 351 10*3/uL (ref 150–450)
RBC: 4.68 x10E6/uL (ref 3.77–5.28)
RDW: 13.1 % (ref 11.7–15.4)
WBC: 8.8 10*3/uL (ref 3.4–10.8)

## 2023-09-09 LAB — LIPID PANEL
Chol/HDL Ratio: 4.4 ratio (ref 0.0–4.4)
Cholesterol, Total: 235 mg/dL — ABNORMAL HIGH (ref 100–199)
HDL: 54 mg/dL (ref >39)
LDL Chol Calc (NIH): 164 mg/dL — ABNORMAL HIGH (ref 0–99)
Triglycerides: 95 mg/dL (ref 0–149)
VLDL Cholesterol Cal: 17 mg/dL (ref 5–40)

## 2023-09-09 LAB — HEP, RPR, HIV PANEL
HIV Screen 4th Generation wRfx: NONREACTIVE
Hepatitis B Surface Ag: NEGATIVE
RPR Ser Ql: NONREACTIVE

## 2023-09-09 LAB — TSH+T4F+T3FREE
Free T4: 1.09 ng/dL (ref 0.82–1.77)
T3, Free: 2.7 pg/mL (ref 2.0–4.4)
TSH: 1.52 u[IU]/mL (ref 0.450–4.500)

## 2023-09-12 ENCOUNTER — Telehealth: Payer: Self-pay

## 2023-09-12 LAB — CERVICOVAGINAL ANCILLARY ONLY
Bacterial Vaginitis (gardnerella): POSITIVE — AB
Candida Glabrata: NEGATIVE
Candida Vaginitis: POSITIVE — AB
Chlamydia: NEGATIVE
Comment: NEGATIVE
Comment: NEGATIVE
Comment: NEGATIVE
Comment: NEGATIVE
Comment: NEGATIVE
Comment: NORMAL
Neisseria Gonorrhea: NEGATIVE
Trichomonas: NEGATIVE

## 2023-09-12 MED ORDER — METRONIDAZOLE 500 MG PO TABS
500.0000 mg | ORAL_TABLET | Freq: Two times a day (BID) | ORAL | 0 refills | Status: AC
Start: 2023-09-12 — End: 2023-09-19

## 2023-09-12 MED ORDER — FLUCONAZOLE 150 MG PO TABS
150.0000 mg | ORAL_TABLET | Freq: Once | ORAL | 1 refills | Status: AC
Start: 2023-09-12 — End: 2023-09-12

## 2023-09-12 NOTE — Telephone Encounter (Signed)
Verified name and DOB. Reviewed results positive for BV and yeast. Rx for both sent to patient's pharmacy. No further questions.

## 2023-09-12 NOTE — Addendum Note (Signed)
Addended by: Autumn Messing on: 09/12/2023 02:08 PM   Modules accepted: Orders

## 2023-09-13 LAB — CYTOLOGY - PAP
Comment: NEGATIVE
Diagnosis: NEGATIVE
High risk HPV: NEGATIVE

## 2023-12-30 ENCOUNTER — Telehealth: Payer: Self-pay

## 2024-01-04 ENCOUNTER — Ambulatory Visit: Payer: BC Managed Care – PPO

## 2024-01-04 VITALS — BP 114/72 | HR 72 | Ht 70.0 in | Wt 249.0 lb

## 2024-01-04 DIAGNOSIS — Z3009 Encounter for other general counseling and advice on contraception: Secondary | ICD-10-CM | POA: Diagnosis not present

## 2024-01-04 DIAGNOSIS — Z30011 Encounter for initial prescription of contraceptive pills: Secondary | ICD-10-CM | POA: Diagnosis not present

## 2024-01-04 MED ORDER — SLYND 4 MG PO TABS
1.0000 | ORAL_TABLET | Freq: Every day | ORAL | 12 refills | Status: DC
Start: 2024-01-04 — End: 2024-02-22

## 2024-01-04 NOTE — Progress Notes (Signed)
 d  GYN ENCOUNTER  Encounter for Irregular bleeding with COC  Subjective  HPI: Sarah Bryant is a 38 y.o. G2P1011 who presents today for   IUD removed in September and Rx for COC provided. Started bleeding about a week after starting pills, now bleeding almost every day, some days heavier than others with blood clots and cramps.   History of regular periods until she got pregnant at 12, then has been irregular since starting birth control after giving birth. Started with Depo but gained a lot of weight. Tried other forms of contraception after son was born at age 38 including Nexplanon and then IUD. IUD worked well for her and controled her bleeding, but she was having issues with her partner feeling the strings which is why she had it removed in September.  Tried a pill from her cousin's Slynd  pack when her bleeding was bad, and it stopped her bleeding for that day. Would like to try this form of pill.  Past Medical History:  Diagnosis Date   Bilateral chronic knee pain    Genital herpes    History of Papanicolaou smear of cervix 01/18/12; 05/26/16   -/-; -/- ct/gc neg;   Obesity (BMI 35.0-39.9 without comorbidity)    Trigeminal neuralgia pain    Past Surgical History:  Procedure Laterality Date   WISDOM TOOTH EXTRACTION     OB History     Gravida  2   Para  1   Term  1   Preterm      AB  1   Living  1      SAB      IAB  1   Ectopic      Multiple      Live Births  1          No Known Allergies  Review of Systems  12 point ROS negative except for pertinent positives noted in HPO above.   Objective  BP 114/72   Pulse 72   Ht 5' 10 (1.778 m)   Wt 249 lb (112.9 kg)   LMP  (Approximate)   BMI 35.73 kg/m   Physical examination GENERAL APPEARANCE: alert, well appearing LUNGS: normal work of breathing HEART: normal heart rate  Assessment/Plan - Discussed different options for COCs vs switching to POP and the r/b/a and efficacy. She would  like to try the Slynd  option. Discussed that insurance may not cover, provided with link to Slynd  website for discount.  - If Slynd  does not provide relief for bleeding, may want to consider trying IUD again since it was effective for her previously.   Lauraine PARAS Ayme Short, CNM  01/04/24 2:31 PM

## 2024-02-08 ENCOUNTER — Ambulatory Visit: Payer: BC Managed Care – PPO

## 2024-02-08 VITALS — BP 112/75 | HR 79 | Ht 70.0 in | Wt 247.0 lb

## 2024-02-08 DIAGNOSIS — N939 Abnormal uterine and vaginal bleeding, unspecified: Secondary | ICD-10-CM | POA: Diagnosis not present

## 2024-02-08 NOTE — Progress Notes (Signed)
   GYN ENCOUNTER  Encounter for AUB  Subjective  HPI: Sarah Bryant is a 38 y.o. G2P1011 who presents today for   Started Slynd on 01/05/24 and heavy vaginal bleeding stopped until 01/21/24 when she had one day of extremely heavy bleeding with large clots that almost brought her to the ED. The bleeding stopped the next day, and she has not had any since that time. She is taking the [facility] with continuous cycling and is happy with it.   She shared her experience with a nurse friend who has had similar symptoms in the past and has a history of fibroids requiring a hysterectomy, so she is now concerned she may have fibroids and is wondering if she needs to be checked for them.   Past Medical History:  Diagnosis Date   Bilateral chronic knee pain    Genital herpes    History of Papanicolaou smear of cervix 01/18/12; 05/26/16   -/-; -/- ct/gc neg;   Obesity (BMI 35.0-39.9 without comorbidity)    Trigeminal neuralgia pain    Past Surgical History:  Procedure Laterality Date   WISDOM TOOTH EXTRACTION     OB History     Gravida  2   Para  1   Term  1   Preterm      AB  1   Living  1      SAB      IAB  1   Ectopic      Multiple      Live Births  1          No Known Allergies  Review of Systems  12 point ROS negative except for pertinent positives noted in HPO above.   Objective  Ht 5\' 10"  (1.778 m)   Wt 247 lb (112 kg)   BMI 35.44 kg/m   Physical examination GENERAL APPEARANCE: alert, well appearing LUNGS: normal work of breathing HEART: normal heart rate  Assessment/Plan - Reviewed that full regulation of bleeding with initiation of new hormonal contraception can take up to 3-4 cycles.  - We discussed the option of a TVUS to rule out fibroids as the source of her bleeding but that the options for management would remain the same including the use of hormonal contraception for management of heavy bleeding. Given that she is currently happy with the  [facility] and is not having bleeding at this time, she declines [place].  - Recommended follow up in clinic if bleeding or other symptoms resume.   Lindalou Hose Colum Colt, CNM  02/08/24 3:17 PM

## 2024-02-22 ENCOUNTER — Other Ambulatory Visit: Payer: Self-pay

## 2024-02-22 DIAGNOSIS — Z30011 Encounter for initial prescription of contraceptive pills: Secondary | ICD-10-CM

## 2024-02-22 MED ORDER — SLYND 4 MG PO TABS
1.0000 | ORAL_TABLET | Freq: Every day | ORAL | 4 refills | Status: AC
Start: 2024-02-22 — End: ?

## 2024-02-27 ENCOUNTER — Ambulatory Visit
Admission: RE | Admit: 2024-02-27 | Discharge: 2024-02-27 | Disposition: A | Discharge status: Home / Self Care | Source: Ambulatory Visit | Attending: Emergency Medicine | Admitting: Emergency Medicine

## 2024-02-27 VITALS — BP 122/80 | HR 85 | Temp 99.1°F | Resp 19

## 2024-02-27 DIAGNOSIS — K529 Noninfective gastroenteritis and colitis, unspecified: Secondary | ICD-10-CM | POA: Insufficient documentation

## 2024-02-27 LAB — RESP PANEL BY RT-PCR (FLU A&B, COVID) ARPGX2
Influenza A by PCR: NEGATIVE
Influenza B by PCR: NEGATIVE
SARS Coronavirus 2 by RT PCR: NEGATIVE

## 2024-02-27 MED ORDER — DICYCLOMINE HCL 20 MG PO TABS: 20.0000 mg | ORAL_TABLET | Freq: Two times a day (BID) | ORAL | 0 refills | Status: AC

## 2024-02-27 MED ORDER — ONDANSETRON 8 MG PO TBDP: 8.0000 mg | ORAL_TABLET | Freq: Three times a day (TID) | ORAL | 0 refills | Status: AC | PRN

## 2024-02-27 NOTE — ED Provider Notes (Signed)
 [facility]    CSN: [id number] Arrival date & time: 02/27/24  1504      History   Chief Complaint Chief Complaint  Patient presents with   Chills    Stomach pain fever soreness - Entered by patient   Headache   Fever   Generalized Body Aches    HPI Sarah Bryant is a 38 y.o. female.   HPI  38 year old female with past medical history significant for trigeminal neuralgia, HSV-2, and bilateral chronic knee pain presents for evaluation of fever with a Tmax of 101, headache, body aches, chills, and diarrhea that started today.  She denies any runny nose, nasal congestion, sore throat, ear pain, cough, nausea, or vomiting.  No known sick contacts.  Past Medical History:  Diagnosis Date   Bilateral chronic knee pain    Genital herpes    History of Papanicolaou smear of cervix 01/18/12; 05/26/16   -/-; -/- ct/gc neg;   Obesity (BMI 35.0-39.9 without comorbidity)    Trigeminal neuralgia pain     Patient Active Problem List   Diagnosis Date Noted   Well woman exam 09/08/2023   Herpes simplex vulvovaginitis 08/17/2021   Elevated lipids 08/17/2021   Pre-diabetes 08/17/2021   Obesity (BMI 35.0-39.9 without comorbidity)    Genital herpes    Bilateral chronic knee pain    Trigeminal neuralgia pain     Past Surgical History:  Procedure Laterality Date   WISDOM TOOTH EXTRACTION      OB History     Gravida  2   Para  1   Term  1   Preterm      AB  1   Living  1      SAB      IAB  1   Ectopic      Multiple      Live Births  1            Home Medications    Prior to Admission medications   Medication Sig Start Date End Date Taking? Authorizing Provider  dicyclomine (BENTYL) 20 MG tablet Take 1 tablet (20 mg total) by mouth 2 (two) times daily. 02/27/24  Yes Becky Augusta, NP  ondansetron (ZOFRAN-ODT) 8 MG disintegrating tablet Take 1 tablet (8 mg total) by mouth every 8 (eight) hours as needed for nausea or vomiting. 02/27/24  Yes  Becky Augusta, NP  Drospirenone (SLYND) 4 MG TABS Take 1 tablet (4 mg total) by mouth daily. May skip non-hormonal pills and continue directly to next package. 02/22/24  Yes Free, Lindalou Hose, CNM  valACYclovir (VALTREX) 500 MG tablet Take 500 mg by mouth daily.    [provider]  SUMAtriptan (IMITREX) 50 MG tablet Take 1 tablet (50 mg total) by mouth once for 1 dose. May repeat in 2 hours if headache persists or recurs. 02/06/18 07/16/20  Tommie Sams, DO    Family History Family History  Problem Relation Age of Onset   Thyroid disease Father    Hyperlipidemia Father    Hypertension Father    Lung cancer Maternal Grandmother 70   Breast cancer Neg Hx    Ovarian cancer Neg Hx    Diabetes Neg Hx     Social History Social History   Tobacco Use   Smoking status: Never   Smokeless tobacco: Never  Vaping Use   Vaping status: Never Used  Substance Use Topics   Alcohol use: No   Drug use: No     Allergies  Patient has no known allergies.   Review of Systems Review of Systems  Constitutional:  Positive for fever.  HENT:  Negative for congestion, ear pain, rhinorrhea and sore throat.   Respiratory:  Negative for cough.   Gastrointestinal:  Positive for diarrhea. Negative for abdominal pain, nausea and vomiting.  Skin:  Negative for rash.     Physical Exam Triage Vital Signs ED Triage Vitals  Encounter Vitals Group     BP      Systolic BP Percentile      Diastolic BP Percentile      Pulse      Resp      Temp      Temp src      SpO2      Weight      Height      Head Circumference      Peak Flow      Pain Score      Pain Loc      Pain Education      Exclude from Growth Chart    No data found.  Updated Vital Signs BP 122/80 (BP Location: Right Arm)   Pulse 85   Temp 99.1 F (37.3 C) (Oral)   Resp 19   LMP 02/23/2024 (Exact Date)   SpO2 96%   Visual Acuity Right Eye Distance:   Left Eye Distance:   Bilateral Distance:    Right Eye Near:   Left  Eye Near:    Bilateral Near:     Physical Exam Vitals and nursing note reviewed.  Constitutional:      Appearance: Normal appearance. She is not ill-appearing.  HENT:     Head: Normocephalic and atraumatic.     Right Ear: Tympanic membrane, ear canal and external ear normal. There is no impacted cerumen.     Left Ear: Tympanic membrane, ear canal and external ear normal. There is no impacted cerumen.     Nose: Congestion and rhinorrhea present.     Comments: This mucosa is erythematous and edematous with scant clear discharge in both nares.    Mouth/Throat:     Mouth: Mucous membranes are moist.     Pharynx: Oropharynx is clear. No oropharyngeal exudate or posterior oropharyngeal erythema.  Cardiovascular:     Rate and Rhythm: Normal rate and regular rhythm.     Pulses: Normal pulses.     Heart sounds: Normal heart sounds. No murmur heard.    No friction rub. No gallop.  Pulmonary:     Effort: Pulmonary effort is normal.     Breath sounds: Normal breath sounds. No wheezing, rhonchi or rales.  Musculoskeletal:     Cervical back: Normal range of motion and neck supple.  Lymphadenopathy:     Cervical: No cervical adenopathy.  Skin:    General: Skin is warm and dry.     Capillary Refill: Capillary refill takes less than 2 seconds.     Findings: No rash.  Neurological:     General: No focal deficit present.     Mental Status: She is alert and oriented to person, place, and time.      UC Treatments / Results  Labs (all labs ordered are listed, but only abnormal results are displayed) Labs Reviewed  RESP PANEL BY RT-PCR (FLU A&B, COVID) [id number]    EKG   Radiology No results found.  Procedures Procedures (including critical care time)  Medications Ordered in UC Medications - No data to display  Initial Impression / Assessment  and Plan / UC Course  I have reviewed the triage vital signs and the nursing notes.  Pertinent labs & imaging results that were available  during my care of the patient were reviewed by me and considered in my medical decision making (see chart for details).   Patient is a nontoxic-appearing 38 year old female presenting for evaluation of flulike symptoms outlined HPI above.  Though she is denying any upper or lower respiratory symptoms her physical exam does reveal inflamed nasal mucosa with scant clear discharge in both nares.  Bilateral tympanic membranes are pearly gray in appearance with normal light reflex.  No cervical adenopathy appreciable exam.  Cardiopulmonary exam reveals: Sounds in all fields.  Differential diagnose include COVID, influenza, viral respiratory illness, gastroenteritis.  I will order a COVID and flu PCR.  Respiratory panel is negative for COVID and influenza.  I will discharge patient on the diagnosis of gastroenteritis with prescription for Zofran that she can take every 8 hours as needed for nausea vomiting and Bentyl that she can take every 6 hours as needed for any abdominal cramping she may experience.  She may use over-the-counter Tylenol and ibuprofen C for any fever or pain.  Return and ER precautions reviewed.  Work note provided.   Final Clinical Impressions(s) / UC Diagnoses   Final diagnoses:  Noninfectious gastroenteritis, unspecified type     Discharge Instructions      Take the Zofran every 8 hours as needed for nausea and vomiting.  They are an oral disintegrating tablet and you can place them on her under your tongue and then will be absorbed.  Use the Bentyl (dicyclomine) every 6 hours as needed for abdominal cramping.  Use over-the-counter Tylenol and/or ibuprofen according to pack instructions as needed for fever or pain.  Follow a clear liquid diet for the next 6 to 12 hours.  Clear liquids consist of broth, ginger ale, water, Pedialyte, and Jell-O.  After 6 to 12 hours, if you are tolerating clear liquids, you can advance to bland foods such as bananas, rice, applesauce, and  toast.  If you tolerate bland foods you can continue to advance your diet as you see fit.  If you develop a fever over 100.5, increased abdominal pain, bloody vomit, or bloody stool return for reevaluation or go to the ER.      ED Prescriptions     Medication Sig Dispense Auth. Provider   dicyclomine (BENTYL) 20 MG tablet Take 1 tablet (20 mg total) by mouth 2 (two) times daily. 20 tablet Becky Augusta, NP   ondansetron (ZOFRAN-ODT) 8 MG disintegrating tablet Take 1 tablet (8 mg total) by mouth every 8 (eight) hours as needed for nausea or vomiting. 20 tablet Becky Augusta, NP      PDMP not reviewed this encounter.   Becky Augusta, NP 02/27/24 [phone]

## 2024-02-27 NOTE — Discharge Instructions (Addendum)
 Take the Zofran every 8 hours as needed for nausea and vomiting.  They are an oral disintegrating tablet and you can place them on her under your tongue and then will be absorbed.  Use the Bentyl (dicyclomine) every 6 hours as needed for abdominal cramping.  Use over-the-counter Tylenol and/or ibuprofen according to pack instructions as needed for fever or pain.  Follow a clear liquid diet for the next 6 to 12 hours.  Clear liquids consist of broth, ginger ale, water, Pedialyte, and Jell-O.  After 6 to 12 hours, if you are tolerating clear liquids, you can advance to bland foods such as bananas, rice, applesauce, and toast.  If you tolerate bland foods you can continue to advance your diet as you see fit.  If you develop a fever over 100.5, increased abdominal pain, bloody vomit, or bloody stool return for reevaluation or go to the ER.

## 2024-02-27 NOTE — ED Triage Notes (Signed)
 Sx x 1 days  Fever Chills Bodyaches Diarrhea headache

## 2024-04-02 ENCOUNTER — Ambulatory Visit (INDEPENDENT_AMBULATORY_CARE_PROVIDER_SITE_OTHER)

## 2024-04-02 ENCOUNTER — Ambulatory Visit
Admission: EM | Admit: 2024-04-02 | Discharge: 2024-04-02 | Disposition: A | Discharge status: Home / Self Care | Attending: Physician Assistant | Admitting: Physician Assistant

## 2024-04-02 DIAGNOSIS — M25561 Pain in right knee: Secondary | ICD-10-CM | POA: Diagnosis not present

## 2024-04-02 DIAGNOSIS — M25461 Effusion, right knee: Secondary | ICD-10-CM

## 2024-04-02 MED ORDER — NAPROXEN 500 MG PO TABS: 500.0000 mg | ORAL_TABLET | Freq: Two times a day (BID) | ORAL | 0 refills | Status: DC

## 2024-04-02 MED ORDER — KETOROLAC TROMETHAMINE 60 MG/2ML IM SOLN
30.0000 mg | Freq: Once | INTRAMUSCULAR | Status: AC
  Administered 2024-04-02: 30 mg via INTRAMUSCULAR
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
## 2024-04-02 NOTE — Discharge Instructions (Addendum)
-  X-ray was negative.  No fractures but x-rays cannot rule out tears of ligament or meniscus. - Ice the knee and elevate it.  Place a pillow under the knee to help with swelling. - I sent anti-inflammatory medicine to the pharmacy.  You may also take Tylenol.  Use knee brace as needed for pain and weakness. - If no improvement over the next week or symptoms worsen please follow-up with [facility] as you might need to have further imaging of the knee to rule out ligament meniscus tears.  You have a condition requiring you to follow up with Orthopedics so please call one of the following office for appointment:   [facility] Address: [address] [place], [place] [address] Phone: [phone]  [facility] [address], [place], [place] [address] Phone: [phone]  [facility] [address] [place], [place] [address] Phone: [phone]
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
## 2024-04-19 ENCOUNTER — Other Ambulatory Visit: Payer: Self-pay

## 2024-04-19 DIAGNOSIS — A6000 Herpesviral infection of urogenital system, unspecified: Secondary | ICD-10-CM

## 2024-04-19 MED ORDER — VALACYCLOVIR HCL 500 MG PO TABS
500.0000 mg | ORAL_TABLET | Freq: Every day | ORAL | 1 refills | Status: DC
Start: 2024-04-19 — End: 2024-07-09

## 2024-07-09 ENCOUNTER — Other Ambulatory Visit: Payer: Self-pay

## 2024-07-09 DIAGNOSIS — A6000 Herpesviral infection of urogenital system, unspecified: Secondary | ICD-10-CM

## 2024-07-09 MED ORDER — VALACYCLOVIR HCL 500 MG PO TABS
500.0000 mg | ORAL_TABLET | Freq: Every day | ORAL | 7 refills | Status: AC
Start: 2024-07-09 — End: ?

## 2024-07-09 NOTE — Telephone Encounter (Signed)
 error

## 2025-01-11 ENCOUNTER — Emergency Department

## 2025-01-11 ENCOUNTER — Emergency Department: Admission: EM | Admit: 2025-01-11 | Discharge: 2025-01-11 | Disposition: A | Discharge status: Home / Self Care

## 2025-01-11 ENCOUNTER — Ambulatory Visit

## 2025-01-11 ENCOUNTER — Other Ambulatory Visit: Payer: Self-pay

## 2025-01-11 DIAGNOSIS — S39012A Strain of muscle, fascia and tendon of lower back, initial encounter: Secondary | ICD-10-CM | POA: Insufficient documentation

## 2025-01-11 DIAGNOSIS — S199XXA Unspecified injury of neck, initial encounter: Secondary | ICD-10-CM | POA: Diagnosis present

## 2025-01-11 DIAGNOSIS — R519 Headache, unspecified: Secondary | ICD-10-CM | POA: Diagnosis not present

## 2025-01-11 DIAGNOSIS — S161XXA Strain of muscle, fascia and tendon at neck level, initial encounter: Secondary | ICD-10-CM | POA: Insufficient documentation

## 2025-01-11 DIAGNOSIS — Y92410 Unspecified street and highway as the place of occurrence of the external cause: Secondary | ICD-10-CM | POA: Diagnosis not present

## 2025-01-11 MED ORDER — BACLOFEN 10 MG PO TABS: 10.0000 mg | ORAL_TABLET | Freq: Three times a day (TID) | ORAL | 0 refills | Status: AC

## 2025-01-11 MED ORDER — MELOXICAM 15 MG PO TABS: 15.0000 mg | ORAL_TABLET | Freq: Every day | ORAL | 0 refills | Status: AC

## 2025-01-11 MED ORDER — OXYCODONE-ACETAMINOPHEN 5-325 MG PO TABS
1.0000 | ORAL_TABLET | Freq: Once | ORAL | Status: AC
  Administered 2025-01-11: 1 via ORAL
  Filled 2025-01-11: qty 1

## 2025-01-11 NOTE — Discharge Instructions (Signed)
 Ice to all areas that hurt Take medications as prescribed.  May also take Tylenol  Follow-up with your regular doctor as needed Follow-up with orthopedics if not improving in 1 week. Return if worsening
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
